# Patient Record
Sex: Female | Born: 1965 | Race: White | Hispanic: No | Marital: Married | Smoking: Never smoker
Health system: Southern US, Community
[De-identification: ages and names within clinical notes are randomized; demographics above are authoritative.]

## PROBLEM LIST (undated history)

## (undated) DIAGNOSIS — Z8619 Personal history of other infectious and parasitic diseases: Secondary | ICD-10-CM

## (undated) DIAGNOSIS — D649 Anemia, unspecified: Secondary | ICD-10-CM

## (undated) DIAGNOSIS — R32 Unspecified urinary incontinence: Secondary | ICD-10-CM

## (undated) DIAGNOSIS — I341 Nonrheumatic mitral (valve) prolapse: Secondary | ICD-10-CM

## (undated) DIAGNOSIS — F419 Anxiety disorder, unspecified: Secondary | ICD-10-CM

## (undated) DIAGNOSIS — E785 Hyperlipidemia, unspecified: Secondary | ICD-10-CM

## (undated) DIAGNOSIS — I4510 Unspecified right bundle-branch block: Secondary | ICD-10-CM

## (undated) DIAGNOSIS — R002 Palpitations: Secondary | ICD-10-CM

## (undated) HISTORY — DX: Unspecified right bundle-branch block: I45.10

## (undated) HISTORY — PX: TUBAL LIGATION: SHX77

## (undated) HISTORY — DX: Anemia, unspecified: D64.9

## (undated) HISTORY — DX: Palpitations: R00.2

## (undated) HISTORY — DX: Unspecified urinary incontinence: R32

## (undated) HISTORY — DX: Personal history of other infectious and parasitic diseases: Z86.19

## (undated) HISTORY — DX: Nonrheumatic mitral (valve) prolapse: I34.1

## (undated) HISTORY — PX: CHOLECYSTECTOMY: SHX55

## (undated) HISTORY — DX: Anxiety disorder, unspecified: F41.9

## (undated) HISTORY — DX: Hyperlipidemia, unspecified: E78.5

---

## 1994-08-23 HISTORY — PX: TUBAL LIGATION: SHX77

## 2001-04-04 ENCOUNTER — Other Ambulatory Visit: Admission: RE | Admit: 2001-04-04 | Discharge: 2001-04-04 | Payer: Self-pay | Admitting: Obstetrics & Gynecology

## 2002-04-05 ENCOUNTER — Other Ambulatory Visit: Admission: RE | Admit: 2002-04-05 | Discharge: 2002-04-05 | Payer: Self-pay | Admitting: Obstetrics & Gynecology

## 2003-04-08 ENCOUNTER — Other Ambulatory Visit: Admission: RE | Admit: 2003-04-08 | Discharge: 2003-04-08 | Payer: Self-pay | Admitting: Obstetrics & Gynecology

## 2004-04-08 ENCOUNTER — Other Ambulatory Visit: Admission: RE | Admit: 2004-04-08 | Discharge: 2004-04-08 | Payer: Self-pay | Admitting: Obstetrics & Gynecology

## 2005-04-13 ENCOUNTER — Other Ambulatory Visit: Admission: RE | Admit: 2005-04-13 | Discharge: 2005-04-13 | Payer: Self-pay | Admitting: Obstetrics & Gynecology

## 2007-07-10 ENCOUNTER — Encounter: Admission: RE | Admit: 2007-07-10 | Discharge: 2007-07-10 | Payer: Self-pay | Admitting: Family Medicine

## 2007-09-12 ENCOUNTER — Encounter: Admission: RE | Admit: 2007-09-12 | Discharge: 2007-09-12 | Payer: Self-pay | Admitting: Family Medicine

## 2010-06-29 ENCOUNTER — Ambulatory Visit: Payer: Self-pay | Admitting: Cardiology

## 2010-09-13 ENCOUNTER — Encounter: Payer: Self-pay | Admitting: Family Medicine

## 2010-12-01 ENCOUNTER — Telehealth: Payer: Self-pay | Admitting: Cardiology

## 2010-12-01 NOTE — Telephone Encounter (Signed)
Needs to get her 6 month bloodwork scheduled.

## 2010-12-01 NOTE — Telephone Encounter (Signed)
RN scheduled labs with pt.

## 2010-12-16 ENCOUNTER — Other Ambulatory Visit: Payer: Self-pay | Admitting: *Deleted

## 2010-12-16 DIAGNOSIS — E7800 Pure hypercholesterolemia, unspecified: Secondary | ICD-10-CM

## 2010-12-29 ENCOUNTER — Other Ambulatory Visit (INDEPENDENT_AMBULATORY_CARE_PROVIDER_SITE_OTHER): Payer: BC Managed Care – PPO | Admitting: *Deleted

## 2010-12-29 DIAGNOSIS — E7800 Pure hypercholesterolemia, unspecified: Secondary | ICD-10-CM

## 2010-12-29 LAB — HEPATIC FUNCTION PANEL
ALT: 23 U/L (ref 0–35)
Bilirubin, Direct: 0.1 mg/dL (ref 0.0–0.3)
Total Protein: 6.3 g/dL (ref 6.0–8.3)

## 2010-12-29 LAB — LIPID PANEL
Cholesterol: 197 mg/dL (ref 0–200)
HDL: 56 mg/dL (ref >39.00)
LDL Cholesterol: 121 mg/dL — ABNORMAL HIGH (ref 0–99)
Total CHOL/HDL Ratio: 4

## 2010-12-29 LAB — BASIC METABOLIC PANEL
Creatinine, Ser: 1 mg/dL (ref 0.4–1.2)
Sodium: 139 mEq/L (ref 135–145)

## 2011-01-01 ENCOUNTER — Telehealth: Payer: Self-pay | Admitting: *Deleted

## 2011-01-01 NOTE — Telephone Encounter (Signed)
Labs reported 

## 2011-01-12 ENCOUNTER — Telehealth: Payer: Self-pay | Admitting: Cardiology

## 2011-01-12 NOTE — Telephone Encounter (Signed)
RETURNED CALL ABOUT SCHEDULING WITH NEW DR.

## 2011-03-27 ENCOUNTER — Other Ambulatory Visit: Payer: Self-pay | Admitting: Cardiology

## 2011-03-29 ENCOUNTER — Other Ambulatory Visit: Payer: Self-pay | Admitting: *Deleted

## 2011-03-29 MED ORDER — SIMVASTATIN 20 MG PO TABS: 20.0000 mg | ORAL_TABLET | Freq: Every day | ORAL | Status: DC

## 2011-03-29 NOTE — Telephone Encounter (Signed)
Fax received from pharmacy. Refill completed. Jodette Donzell Coller RN  

## 2011-07-02 ENCOUNTER — Encounter: Payer: Self-pay | Admitting: *Deleted

## 2011-07-06 ENCOUNTER — Encounter: Payer: Self-pay | Admitting: Cardiovascular Disease

## 2011-07-06 ENCOUNTER — Ambulatory Visit (INDEPENDENT_AMBULATORY_CARE_PROVIDER_SITE_OTHER): Payer: BC Managed Care – PPO | Admitting: Cardiovascular Disease

## 2011-07-06 DIAGNOSIS — R011 Cardiac murmur, unspecified: Secondary | ICD-10-CM

## 2011-07-06 DIAGNOSIS — E785 Hyperlipidemia, unspecified: Secondary | ICD-10-CM

## 2011-07-06 DIAGNOSIS — I341 Nonrheumatic mitral (valve) prolapse: Secondary | ICD-10-CM

## 2011-07-06 DIAGNOSIS — I059 Rheumatic mitral valve disease, unspecified: Secondary | ICD-10-CM

## 2011-07-06 LAB — BASIC METABOLIC PANEL
BUN: 12 mg/dL (ref 6–23)
Calcium: 8.8 mg/dL (ref 8.4–10.5)
Creatinine, Ser: 0.9 mg/dL (ref 0.4–1.2)
GFR: 74.2 mL/min (ref >60.00)
Glucose, Bld: 88 mg/dL (ref 70–99)
Potassium: 4.3 mEq/L (ref 3.5–5.1)

## 2011-07-06 LAB — HEPATIC FUNCTION PANEL
ALT: 20 U/L (ref 0–35)
AST: 22 U/L (ref 0–37)
Albumin: 3.4 g/dL — ABNORMAL LOW (ref 3.5–5.2)
Alkaline Phosphatase: 25 U/L — ABNORMAL LOW (ref 39–117)
Bilirubin, Direct: 0.1 mg/dL (ref 0.0–0.3)
Total Bilirubin: 1 mg/dL (ref 0.3–1.2)

## 2011-07-06 LAB — LIPID PANEL
Cholesterol: 170 mg/dL (ref 0–200)
LDL Cholesterol: 90 mg/dL (ref 0–99)

## 2011-07-06 MED ORDER — ATORVASTATIN CALCIUM 20 MG PO TABS: 20.0000 mg | ORAL_TABLET | Freq: Every day | ORAL | Status: DC

## 2011-07-06 NOTE — Assessment & Plan Note (Signed)
She remains quite stable.  I'll change her Simvastatin to Atorvastatin 20 mg daily.  We'll recheck her cholesterol levels in 3 months and again in one year.

## 2011-07-06 NOTE — Patient Instructions (Addendum)
Your physician wants you to follow-up in: 1 year You will receive a reminder letter in the mail two months in advance. If you don't receive a letter, please call our office to schedule the follow-up appointment.  Your physician recommends that you return for a FASTING lipid profile: 3 months and today.  Your physician has requested that you have an echocardiogram. Echocardiography is a painless test that uses sound waves to create images of your heart. It provides your doctor with information about the size and shape of your heart and how well your heart's chambers and valves are working. This procedure takes approximately one hour. There are no restrictions for this procedure.   Your physician has recommended you make the following change in your medication:   1) STOP zocor  2) START atorvastatin 20 mg  I called pt with procedure and dx code to check insurance,

## 2011-07-06 NOTE — Progress Notes (Signed)
  Jessica Bradford Date of Birth  1966/07/01 [facility] [address]    [address] [place], [place]  [address] [phone]  Fax  [phone]  History of Present Illness:  Jessica Bradford is a 45 year old female with a history of hypercholesterolemia. She has a very strong family history of coronary artery disease. She's done very well since she last saw Dr. tenet last year. She walks 4-6 miles every day.  She denies any chest pain or shortness of breath with her walking.  She has a history of mitral valve prolapse. She has occasional palpitations.  These  typically occur when she is at rest.   Current Outpatient Prescriptions on File Prior to Visit  Medication Sig Dispense Refill  . ALPRAZolam (XANAX) 0.5 MG tablet Take 0.5 mg by mouth as needed.        Marland Kitchen aspirin 81 MG tablet Take 81 mg by mouth daily.        . multivitamin (THERAGRAN) per tablet Take 1 tablet by mouth daily.        . simvastatin (ZOCOR) 20 MG tablet Take 1 tablet (20 mg total) by mouth at bedtime.  30 tablet  3    Allergies  Allergen Reactions  . Doxycycline   . Sulfur     Past Medical History  Diagnosis Date  . Hyperlipidemia   . Incomplete right bundle branch block   . Mitral valve prolapse   . Heart palpitations     Past Surgical History  Procedure Date  . Cholecystectomy     History  Smoking status  . Never Smoker   Smokeless tobacco  . Not on file    History  Alcohol Use: Not on file    Family History  Problem Relation Age of Onset  . Heart attack Father   . Heart disease      " unkle "  . Heart attack Mother     Reviw of Systems:  Reviewed in the HPI.  All other systems are negative.  Physical Exam: BP 118/80  Pulse 65  Ht 5\' 6"  (1.676 m)  Wt 188 lb 1.9 oz (85.331 kg)  BMI 30.36 kg/m2 The patient is alert and oriented x 3.  The mood and affect are normal.   Skin: warm and dry.  Color is normal.    HEENT:   Her neck is supple. She has normal carotids. There is no  JVD.  Lungs: Her lung exam is clear.   Heart: Regular rate, S1-S2. I did not hear a midsystolic click. She has a very soft systolic murmur.    Abdomen: Her abdomen is soft. She has good bowel sounds. There is no hepatosplenomegaly.  Extremities:  No clubbing cyanosis or edema.  Neuro:  The exam is nonfocal. Her gait is normal to    ECG: Normal sinus rhythm. She has left axis deviation. There is incomplete right bundle branch block  Assessment / Plan:

## 2011-07-06 NOTE — Assessment & Plan Note (Signed)
She is a soft systolic murmur. She had an echocardiogram approximately 17 years ago. We'll repeat her echocardiogram for further assessment of her mitral valve prolapse.

## 2011-07-13 ENCOUNTER — Ambulatory Visit (HOSPITAL_COMMUNITY): Payer: BC Managed Care – PPO | Attending: Cardiovascular Disease

## 2011-07-13 DIAGNOSIS — E785 Hyperlipidemia, unspecified: Secondary | ICD-10-CM | POA: Insufficient documentation

## 2011-07-13 DIAGNOSIS — I059 Rheumatic mitral valve disease, unspecified: Secondary | ICD-10-CM | POA: Insufficient documentation

## 2011-07-13 DIAGNOSIS — I379 Nonrheumatic pulmonary valve disorder, unspecified: Secondary | ICD-10-CM | POA: Insufficient documentation

## 2011-07-13 DIAGNOSIS — R011 Cardiac murmur, unspecified: Secondary | ICD-10-CM | POA: Insufficient documentation

## 2011-07-13 DIAGNOSIS — I079 Rheumatic tricuspid valve disease, unspecified: Secondary | ICD-10-CM | POA: Insufficient documentation

## 2011-07-26 ENCOUNTER — Other Ambulatory Visit: Payer: Self-pay | Admitting: Cardiovascular Disease

## 2011-10-04 ENCOUNTER — Other Ambulatory Visit: Payer: BC Managed Care – PPO | Admitting: *Deleted

## 2011-11-01 ENCOUNTER — Other Ambulatory Visit (INDEPENDENT_AMBULATORY_CARE_PROVIDER_SITE_OTHER): Payer: BC Managed Care – PPO

## 2011-11-01 DIAGNOSIS — E785 Hyperlipidemia, unspecified: Secondary | ICD-10-CM

## 2011-11-01 DIAGNOSIS — R011 Cardiac murmur, unspecified: Secondary | ICD-10-CM

## 2011-11-01 LAB — HEPATIC FUNCTION PANEL
ALT: 20 U/L (ref 0–35)
Albumin: 3.4 g/dL — ABNORMAL LOW (ref 3.5–5.2)
Total Protein: 6.4 g/dL (ref 6.0–8.3)

## 2011-11-01 LAB — LIPID PANEL
Cholesterol: 176 mg/dL (ref 0–200)
Total CHOL/HDL Ratio: 3
VLDL: 18.4 mg/dL (ref 0.0–40.0)

## 2011-11-01 LAB — BASIC METABOLIC PANEL
Calcium: 8.9 mg/dL (ref 8.4–10.5)
Chloride: 106 mEq/L (ref 96–112)
GFR: 60.31 mL/min (ref >60.00)
Sodium: 136 mEq/L (ref 135–145)

## 2011-12-02 ENCOUNTER — Other Ambulatory Visit: Payer: Self-pay | Admitting: Cardiovascular Disease

## 2011-12-02 ENCOUNTER — Telehealth: Payer: Self-pay | Admitting: Cardiovascular Disease

## 2011-12-02 DIAGNOSIS — R011 Cardiac murmur, unspecified: Secondary | ICD-10-CM

## 2011-12-02 DIAGNOSIS — E785 Hyperlipidemia, unspecified: Secondary | ICD-10-CM

## 2011-12-02 MED ORDER — ATORVASTATIN CALCIUM 20 MG PO TABS: 20.0000 mg | ORAL_TABLET | Freq: Every day | ORAL | Status: DC

## 2011-12-02 NOTE — Telephone Encounter (Signed)
Called and left message for pt that her atorvastatin (LIPITOR) 20 MG tablet has been called into her pharmacy -- Rx will be done before noon today.

## 2011-12-02 NOTE — Telephone Encounter (Signed)
Pt needs atorvastatin called in this am, pharmacy requested Tuesday and as of this am they don't have it, pt going out of town today at 2p, pls call asap

## 2011-12-13 ENCOUNTER — Other Ambulatory Visit: Payer: Self-pay | Admitting: Cardiovascular Disease

## 2011-12-13 DIAGNOSIS — R011 Cardiac murmur, unspecified: Secondary | ICD-10-CM

## 2011-12-13 DIAGNOSIS — E785 Hyperlipidemia, unspecified: Secondary | ICD-10-CM

## 2011-12-13 MED ORDER — ATORVASTATIN CALCIUM 20 MG PO TABS: 20.0000 mg | ORAL_TABLET | Freq: Every day | ORAL | Status: DC

## 2012-05-23 ENCOUNTER — Telehealth: Payer: Self-pay | Admitting: Cardiovascular Disease

## 2012-05-23 NOTE — Telephone Encounter (Signed)
LMTCB

## 2012-05-23 NOTE — Telephone Encounter (Signed)
plz return call to pt [phone], she wants to discuss labs for upcoming appnt .

## 2012-05-24 NOTE — Telephone Encounter (Signed)
Pt rtn your call

## 2012-05-24 NOTE — Telephone Encounter (Signed)
Discussed lab work, pt will come fasting and will order after visit, pt agreed to plan.

## 2012-07-11 ENCOUNTER — Encounter: Payer: Self-pay | Admitting: Cardiovascular Disease

## 2012-07-11 ENCOUNTER — Ambulatory Visit (INDEPENDENT_AMBULATORY_CARE_PROVIDER_SITE_OTHER): Payer: BC Managed Care – PPO | Admitting: Cardiovascular Disease

## 2012-07-11 VITALS — BP 104/74 | HR 65 | Ht 66.0 in | Wt 195.1 lb

## 2012-07-11 DIAGNOSIS — I341 Nonrheumatic mitral (valve) prolapse: Secondary | ICD-10-CM

## 2012-07-11 DIAGNOSIS — E785 Hyperlipidemia, unspecified: Secondary | ICD-10-CM

## 2012-07-11 DIAGNOSIS — I059 Rheumatic mitral valve disease, unspecified: Secondary | ICD-10-CM

## 2012-07-11 LAB — BASIC METABOLIC PANEL
CO2: 27 mEq/L (ref 19–32)
Glucose, Bld: 103 mg/dL — ABNORMAL HIGH (ref 70–99)
Potassium: 5 mEq/L (ref 3.5–5.1)
Sodium: 135 mEq/L (ref 135–145)

## 2012-07-11 LAB — HEPATIC FUNCTION PANEL
Bilirubin, Direct: 0.2 mg/dL (ref 0.0–0.3)
Total Bilirubin: 1.2 mg/dL (ref 0.3–1.2)

## 2012-07-11 LAB — LIPID PANEL
Triglycerides: 78 mg/dL (ref 0.0–149.0)
VLDL: 15.6 mg/dL (ref 0.0–40.0)

## 2012-07-11 NOTE — Assessment & Plan Note (Signed)
Jessica Bradford is doing well. She's not had any chest pains or shortness breath. She is tolerating her medications well.  We'll check a Lipomed profile today. We'll also check fasting lipid level, hepatic profile, and basic metabolic profile.  I will see her again in one year for an office visit and similar blood work.

## 2012-07-11 NOTE — Progress Notes (Signed)
  Jessica Bradford Date of Birth  09-Jan-1966 [facility] [address]    [address] [place], [place]  [address] [phone]  Fax  [phone]  Problem List 1. Hyperliidemia   History of Present Illness:  Jessica Bradford is a 46 year old female with a history of hypercholesterolemia. She has a very strong family history of coronary artery disease. She's done very well since she last saw Dr. tenet last year. She walks 4-6 miles every day.  She denies any chest pain or shortness of breath with her walking.  She has a history of mitral valve prolapse. She has occasional palpitations.  These  typically occur when she is at rest.   She is recovering from an episode of cellulitis. She apparently stepped on a splinter on her deck and developed cellulitis in her foot.   She was on antibiotics for several weeks. She is still not exercising because of some persistent foot pain and swelling.  Current Outpatient Prescriptions on File Prior to Visit  Medication Sig Dispense Refill  . ALPRAZolam (XANAX) 0.5 MG tablet Take 0.5 mg by mouth as needed.        Marland Kitchen aspirin 81 MG tablet Take 81 mg by mouth daily.        Marland Kitchen atorvastatin (LIPITOR) 20 MG tablet Take 1 tablet (20 mg total) by mouth daily.  90 tablet  2  . multivitamin (THERAGRAN) per tablet Take 1 tablet by mouth daily.          Allergies  Allergen Reactions  . Doxycycline   . Sulfur     Past Medical History  Diagnosis Date  . Hyperlipidemia   . Incomplete right bundle branch block   . Mitral valve prolapse   . Heart palpitations     Past Surgical History  Procedure Date  . Cholecystectomy     History  Smoking status  . Never Smoker   Smokeless tobacco  . Not on file    History  Alcohol Use: Not on file    Family History  Problem Relation Age of Onset  . Heart attack Father   . Heart disease      " unkle "  . Heart attack Mother     Reviw of Systems:  Reviewed in the HPI.  All other systems are  negative.  Physical Exam: BP 104/74  Pulse 65  Ht 5\' 6"  (1.676 m)  Wt 195 lb 1.9 oz (88.506 kg)  BMI 31.49 kg/m2  SpO2 99% The patient is alert and oriented x 3.  The mood and affect are normal.   Skin: warm and dry.  Color is normal.    HEENT:   Her neck is supple. She has normal carotids. There is no JVD.  Lungs: Her lung exam is clear.   Heart: Regular rate, S1-S2. I did not hear a midsystolic click. She has a very soft systolic murmur.    Abdomen: Her abdomen is soft. She has good bowel sounds. There is no hepatosplenomegaly.  Extremities:  No clubbing cyanosis or edema.  Neuro:  The exam is nonfocal. Her gait is normal to    ECG: 07/11/2012 Normal sinus rhythm at 68. She has left axis deviation. There is incomplete right bundle branch block  Assessment / Plan:

## 2012-07-11 NOTE — Patient Instructions (Addendum)
Your physician wants you to follow-up in: 1 year You will receive a reminder letter in the mail two months in advance. If you don't receive a letter, please call our office to schedule the follow-up appointment.  Your physician recommends that you return for a FASTING lipid profile: today, i will call you with results in 2-3 days, the lipomed profile takes loger and i will call in 1-2 weeks

## 2012-07-12 LAB — NMR, LIPOPROFILE

## 2012-07-21 ENCOUNTER — Telehealth: Payer: Self-pay | Admitting: *Deleted

## 2012-07-21 DIAGNOSIS — Z79899 Other long term (current) drug therapy: Secondary | ICD-10-CM

## 2012-07-21 DIAGNOSIS — E785 Hyperlipidemia, unspecified: Secondary | ICD-10-CM

## 2012-07-21 DIAGNOSIS — R011 Cardiac murmur, unspecified: Secondary | ICD-10-CM

## 2012-07-21 MED ORDER — ATORVASTATIN CALCIUM 80 MG PO TABS: 80.0000 mg | ORAL_TABLET | Freq: Every evening | ORAL | Status: DC

## 2012-07-21 NOTE — Telephone Encounter (Signed)
Message copied by Lesle Chris on Fri Jul 21, 2012  1:46 PM ------      Message from: [place], [place] J      Created: Fri Jul 21, 2012  8:45 AM       Her LDL particle number is [id number] ( goal of < 1000) .      I would suggest increasing her atorvastatin to 80.  Recheck fasting  Lipomed profile and HFP, BMP in 3 months.

## 2012-07-21 NOTE — Telephone Encounter (Signed)
Notes Recorded by Lesle Chris, LPN on 11/91/4782 at 1:45 PM Patient notified. Will send new rx to [facility] at pt request. Patient will call office to schedule lab work the end of February 2014. Notes Recorded by Lesle Chris, LPN on 95/62/1308 at 11:06 AM Left message to return call.

## 2012-08-18 ENCOUNTER — Encounter: Payer: Self-pay | Admitting: Cardiology

## 2012-10-07 ENCOUNTER — Other Ambulatory Visit: Payer: Self-pay

## 2012-10-16 ENCOUNTER — Other Ambulatory Visit (INDEPENDENT_AMBULATORY_CARE_PROVIDER_SITE_OTHER): Payer: BC Managed Care – PPO

## 2012-10-16 DIAGNOSIS — Z79899 Other long term (current) drug therapy: Secondary | ICD-10-CM

## 2012-10-16 DIAGNOSIS — E785 Hyperlipidemia, unspecified: Secondary | ICD-10-CM

## 2012-10-17 LAB — NMR LIPOPROFILE WITH LIPIDS
HDL Size: 9.4 nm (ref >=9.2)
HDL-C: 45 mg/dL (ref >=40)
LDL (calc): 67 mg/dL (ref <100)
LDL Particle Number: 977 nmol/L (ref <1000)
LDL Size: 20.4 nm — ABNORMAL LOW (ref >20.5)
LP-IR Score: 42 (ref <=45)
VLDL Size: 48.1 nm — ABNORMAL HIGH (ref >=46.6)

## 2012-10-18 ENCOUNTER — Ambulatory Visit (INDEPENDENT_AMBULATORY_CARE_PROVIDER_SITE_OTHER): Payer: BC Managed Care – PPO | Admitting: *Deleted

## 2012-10-18 DIAGNOSIS — E785 Hyperlipidemia, unspecified: Secondary | ICD-10-CM

## 2012-10-18 DIAGNOSIS — I059 Rheumatic mitral valve disease, unspecified: Secondary | ICD-10-CM

## 2012-10-18 LAB — BASIC METABOLIC PANEL
Calcium: 9.1 mg/dL (ref 8.4–10.5)
Creatinine, Ser: 1 mg/dL (ref 0.4–1.2)
GFR: 60.73 mL/min (ref >60.00)
Sodium: 138 mEq/L (ref 135–145)

## 2012-10-18 LAB — HEPATIC FUNCTION PANEL
Albumin: 3.6 g/dL (ref 3.5–5.2)
Alkaline Phosphatase: 31 U/L — ABNORMAL LOW (ref 39–117)
Bilirubin, Direct: 0.1 mg/dL (ref 0.0–0.3)

## 2012-10-31 ENCOUNTER — Telehealth: Payer: Self-pay | Admitting: Cardiovascular Disease

## 2012-10-31 NOTE — Telephone Encounter (Signed)
Returned patient's call to inform her that we did not collect CBC or TSH.

## 2012-10-31 NOTE — Telephone Encounter (Signed)
New problem    previous lab drawn on 2/24 . Checking to see if CBC & TSH was drawn as well.     Fax results to  [phone].

## 2013-06-02 ENCOUNTER — Other Ambulatory Visit: Payer: Self-pay | Admitting: Cardiovascular Disease

## 2013-06-28 ENCOUNTER — Other Ambulatory Visit: Payer: Self-pay

## 2013-07-11 ENCOUNTER — Encounter: Payer: Self-pay | Admitting: Cardiovascular Disease

## 2013-07-11 ENCOUNTER — Ambulatory Visit (INDEPENDENT_AMBULATORY_CARE_PROVIDER_SITE_OTHER): Payer: BC Managed Care – PPO | Admitting: Cardiovascular Disease

## 2013-07-11 VITALS — BP 115/65 | HR 66 | Ht 66.0 in | Wt 181.0 lb

## 2013-07-11 DIAGNOSIS — I059 Rheumatic mitral valve disease, unspecified: Secondary | ICD-10-CM

## 2013-07-11 DIAGNOSIS — I341 Nonrheumatic mitral (valve) prolapse: Secondary | ICD-10-CM

## 2013-07-11 DIAGNOSIS — E785 Hyperlipidemia, unspecified: Secondary | ICD-10-CM

## 2013-07-11 LAB — HEPATIC FUNCTION PANEL
ALT: 24 U/L (ref 0–35)
AST: 23 U/L (ref 0–37)
Albumin: 3.6 g/dL (ref 3.5–5.2)
Alkaline Phosphatase: 32 U/L — ABNORMAL LOW (ref 39–117)
Bilirubin, Direct: 0.2 mg/dL (ref 0.0–0.3)
Total Bilirubin: 1.2 mg/dL (ref 0.3–1.2)
Total Protein: 6.7 g/dL (ref 6.0–8.3)

## 2013-07-11 LAB — BASIC METABOLIC PANEL
BUN: 17 mg/dL (ref 6–23)
CO2: 25 mEq/L (ref 19–32)
Chloride: 105 mEq/L (ref 96–112)
GFR: 70.75 mL/min (ref >60.00)
Glucose, Bld: 96 mg/dL (ref 70–99)
Potassium: 4.5 mEq/L (ref 3.5–5.1)
Sodium: 137 mEq/L (ref 135–145)

## 2013-07-11 NOTE — Patient Instructions (Signed)
Your physician recommends that you return for a FASTING lipid profile: TODAY W/ NMR  Your physician wants you to follow-up in: 35 MONTHS You will receive a reminder letter in the mail two months in advance. If you don't receive a letter, please call our office to schedule the follow-up appointment.   Your physician recommends that you continue on your current medications as directed. Please refer to the Current Medication list given to you today.

## 2013-07-11 NOTE — Progress Notes (Signed)
Jessica Bradford Date of Birth  08/11/1966  [facility] [address]    [address] [place], [place]  [address] [phone]  Fax  [phone]  Problem List 1. Hyperliidemia   History of Present Illness:  Jessica Bradford is a 47 year old female with a history of hypercholesterolemia. She has a very strong family history of coronary artery disease. She's done very well since she last saw Dr. Deborah Chalk last year. She walks 4-6 miles every day.  She denies any chest pain or shortness of breath with her walking.  She has a history of mitral valve prolapse. She has occasional palpitations.  These  typically occur when she is at rest.   She is recovering from an episode of cellulitis. She apparently stepped on a splinter on her deck and developed cellulitis in her foot.   She was on antibiotics for several weeks. She is still not exercising because of some persistent foot pain and swelling.  Nov. 19, 2014:  No CP, no dyspnea,  Still walking 5+ miles a day.  Also doing strength training.    She gets a discount from her insurance plan to work out.    Current Outpatient Prescriptions on File Prior to Visit  Medication Sig Dispense Refill  . ALPRAZolam (XANAX) 0.5 MG tablet Take 0.5 mg by mouth as needed.        Marland Kitchen aspirin 81 MG tablet Take 81 mg by mouth daily.        Marland Kitchen atorvastatin (LIPITOR) 80 MG tablet TAKE 1 TABLET EVERY EVENING (DOSE INCREASED 07/21/2012)  90 tablet  0  . multivitamin (THERAGRAN) per tablet Take 1 tablet by mouth daily.        . NON FORMULARY Birth Control Pills Daily       No current facility-administered medications on file prior to visit.    Allergies  Allergen Reactions  . Doxycycline   . Sulfur     Past Medical History  Diagnosis Date  . Hyperlipidemia   . Incomplete right bundle branch block   . Mitral valve prolapse   . Heart palpitations     Past Surgical History  Procedure Laterality Date  . Cholecystectomy      History  Smoking status  . Never  Smoker   Smokeless tobacco  . Not on file    History  Alcohol Use: Not on file    Family History  Problem Relation Age of Onset  . Heart attack Father   . Heart disease      " unkle "  . Heart attack Mother     Reviw of Systems:  Reviewed in the HPI.  All other systems are negative.  Physical Exam: BP 115/65  Pulse 66  Ht 5\' 6"  (1.676 m)  Wt 181 lb (82.101 kg)  BMI 29.23 kg/m2 The patient is alert and oriented x 3.  The mood and affect are normal.   Skin: warm and dry.  Color is normal.    HEENT:   Her neck is supple. She has normal carotids. There is no JVD.  Lungs: Her lung exam is clear.   Heart: Regular rate, S1-S2. I did not hear a midsystolic click. She has a very soft systolic murmur.    Abdomen: Her abdomen is soft. She has good bowel sounds. There is no hepatosplenomegaly.  Extremities:  No clubbing cyanosis or edema.  Neuro:  The exam is nonfocal. Her gait is normal to    ECG: Nov. 19, 2014:  NSR at 61, inc. RBBB.  Assessment / Plan:

## 2013-07-11 NOTE — Assessment & Plan Note (Signed)
Lipids and lipomed levels have been good.  Will check today.  Recheck in 18 months at next ov.  Continue meds.  She is an active walker.

## 2013-07-12 LAB — NMR LIPOPROFILE WITH LIPIDS
HDL Particle Number: 42.8 umol/L (ref >=30.5)
HDL Size: 9.7 nm (ref >=9.2)
HDL-C: 57 mg/dL (ref >=40)
LDL (calc): 60 mg/dL (ref <100)
LP-IR Score: <25 (ref <=45)
Large HDL-P: 11 umol/L (ref >=4.8)
Large VLDL-P: 2 nmol/L (ref <=2.7)
Triglycerides: 96 mg/dL (ref <150)

## 2013-07-16 NOTE — Progress Notes (Signed)
Quick Note:  Patient notified of lab results. Patient verbalized understanding and agreement with current treatment plan. ______ 

## 2013-08-14 ENCOUNTER — Other Ambulatory Visit: Payer: Self-pay | Admitting: Cardiovascular Disease

## 2014-08-27 ENCOUNTER — Other Ambulatory Visit: Payer: Self-pay | Admitting: Cardiovascular Disease

## 2014-11-06 ENCOUNTER — Other Ambulatory Visit: Payer: Self-pay | Admitting: Nurse Practitioner

## 2014-11-06 DIAGNOSIS — E785 Hyperlipidemia, unspecified: Secondary | ICD-10-CM

## 2014-12-23 ENCOUNTER — Ambulatory Visit (INDEPENDENT_AMBULATORY_CARE_PROVIDER_SITE_OTHER): Payer: BLUE CROSS/BLUE SHIELD | Admitting: Cardiovascular Disease

## 2014-12-23 ENCOUNTER — Encounter: Payer: Self-pay | Admitting: Cardiovascular Disease

## 2014-12-23 ENCOUNTER — Other Ambulatory Visit (INDEPENDENT_AMBULATORY_CARE_PROVIDER_SITE_OTHER): Payer: BLUE CROSS/BLUE SHIELD | Admitting: *Deleted

## 2014-12-23 DIAGNOSIS — E785 Hyperlipidemia, unspecified: Secondary | ICD-10-CM

## 2014-12-23 DIAGNOSIS — I059 Rheumatic mitral valve disease, unspecified: Secondary | ICD-10-CM | POA: Diagnosis not present

## 2014-12-23 DIAGNOSIS — I341 Nonrheumatic mitral (valve) prolapse: Secondary | ICD-10-CM

## 2014-12-23 LAB — LIPID PANEL
Cholesterol: 154 mg/dL (ref 0–200)
HDL: 66.5 mg/dL (ref >39.00)
LDL CALC: 73 mg/dL (ref 0–99)
NONHDL: 87.5
Total CHOL/HDL Ratio: 2
Triglycerides: 72 mg/dL (ref 0.0–149.0)
VLDL: 14.4 mg/dL (ref 0.0–40.0)

## 2014-12-23 LAB — HEPATIC FUNCTION PANEL
ALT: 23 U/L (ref 0–35)
AST: 22 U/L (ref 0–37)
Albumin: 3.7 g/dL (ref 3.5–5.2)
Alkaline Phosphatase: 32 U/L — ABNORMAL LOW (ref 39–117)
Bilirubin, Direct: 0.2 mg/dL (ref 0.0–0.3)
TOTAL PROTEIN: 6.8 g/dL (ref 6.0–8.3)
Total Bilirubin: 0.9 mg/dL (ref 0.2–1.2)

## 2014-12-23 LAB — BASIC METABOLIC PANEL
BUN: 18 mg/dL (ref 6–23)
CHLORIDE: 104 meq/L (ref 96–112)
CO2: 27 mEq/L (ref 19–32)
Calcium: 9 mg/dL (ref 8.4–10.5)
Creatinine, Ser: 0.9 mg/dL (ref 0.40–1.20)
GFR: 70.33 mL/min (ref >60.00)
Glucose, Bld: 89 mg/dL (ref 70–99)
Potassium: 4.5 mEq/L (ref 3.5–5.1)
SODIUM: 136 meq/L (ref 135–145)

## 2014-12-23 MED ORDER — ATORVASTATIN CALCIUM 80 MG PO TABS: 80.0000 mg | ORAL_TABLET | Freq: Every day | ORAL | Status: DC

## 2014-12-23 NOTE — Progress Notes (Signed)
Cardiology Office Note   Date:  12/23/2014   ID:  Jessica Bradford, DOB 22-Dec-1965, MRN [id number]  PCP:  Joycelyn Rua, MD  Cardiologist:   Vesta Mixer, MD   1. Hyperliidemia   History of Present Illness:  Jessica Bradford is a 49 year old female with a history of hypercholesterolemia. She has a very strong family history of coronary artery disease. She's done very well since she last saw Dr. Deborah Chalk last year. She walks 4-6 miles every day. She denies any chest pain or shortness of breath with her walking.  She has a history of mitral valve prolapse. She has occasional palpitations. These typically occur when she is at rest.  She is recovering from an episode of cellulitis. She apparently stepped on a splinter on her deck and developed cellulitis in her foot. She was on antibiotics for several weeks. She is still not exercising because of some persistent foot pain and swelling.  Nov. 19, 2014:  No CP, no dyspnea, Still walking 5+ miles a day. Also doing strength training. She gets a discount from her insurance plan to work out    Dec 23, 2014:  Jessica Bradford is a 49 y.o. female who presents for  Follow up of her hyperlipidemia Walks regularly,  Also does strength training.  Trying to watch her diet.     Past Medical History  Diagnosis Date  . Hyperlipidemia   . Incomplete right bundle branch block   . Mitral valve prolapse   . Heart palpitations     Past Surgical History  Procedure Laterality Date  . Cholecystectomy       Current Outpatient Prescriptions  Medication Sig Dispense Refill  . ALPRAZolam (XANAX) 0.5 MG tablet Take 0.5 mg by mouth as needed.      Marland Kitchen aspirin 81 MG tablet Take 81 mg by mouth daily.      Marland Kitchen atorvastatin (LIPITOR) 80 MG tablet TAKE 1 TABLET EVERY EVENING (DOSE INCREASED 11 29 2013) 90 tablet 1  . LOW-OGESTREL 0.3-30 MG-MCG tablet     . multivitamin (THERAGRAN) per tablet Take 1 tablet by mouth daily.      . NON FORMULARY Birth  Control Pills Daily     No current facility-administered medications for this visit.    Allergies:   Sulfa antibiotics; Doxycycline; and Sulfur    Social History:  The patient  reports that she has never smoked. She does not have any smokeless tobacco history on file.   Family History:  The patient's family history includes Heart attack in her father and mother; Heart disease in an other family member.    ROS:  Please see the history of present illness.    Review of Systems: Constitutional:  denies fever, chills, diaphoresis, appetite change and fatigue.  HEENT: denies photophobia, eye pain, redness, hearing loss, ear pain, congestion, sore throat, rhinorrhea, sneezing, neck pain, neck stiffness and tinnitus.  Respiratory: denies SOB, DOE, cough, chest tightness, and wheezing.  Cardiovascular: denies chest pain, palpitations and leg swelling.  Gastrointestinal: denies nausea, vomiting, abdominal pain, diarrhea, constipation, blood in stool.  Genitourinary: denies dysuria, urgency, frequency, hematuria, flank pain and difficulty urinating.  Musculoskeletal: denies  myalgias, back pain, joint swelling, arthralgias and gait problem.   Skin: denies pallor, rash and wound.  Neurological: denies dizziness, seizures, syncope, weakness, light-headedness, numbness and headaches.   Hematological: denies adenopathy, easy bruising, personal or family bleeding history.  Psychiatric/ Behavioral: denies suicidal ideation, mood changes, confusion, nervousness, sleep disturbance and agitation.  All other systems are reviewed and negative.    PHYSICAL EXAM: VS:  BP 122/82 mmHg  Pulse 71  Ht 5\' 6"  (1.676 m)  Wt 187 lb 6.4 oz (85.004 kg)  BMI 30.26 kg/m2 , BMI Body mass index is 30.26 kg/(m^2). GEN: Well no urished, well developed, in no acute distress HEENT: normal Neck: no JVD, carotid bruits, or masses Cardiac: RRR; no murmurs, rubs, or gallops,no edema  Respiratory:  clear to  auscultation bilaterally, normal work of breathing GI: soft, nontender, nondistended, + BS MS: no deformity or atrophy Skin: warm and dry, no rash Neuro:  Strength and sensation are intact Psych: normal   EKG:  EKG is ordered today. The ekg ordered today demonstrates  NSR at 71.  Inc. RBBB    Recent Labs: No results found for requested labs within last 365 days.    Lipid Panel    Component Value Date/Time   CHOL 136 07/11/2013 0925   CHOL 168 07/11/2012 1009   TRIG 96 07/11/2013 0925   TRIG 78.0 07/11/2012 1009   HDL 57 07/11/2013 0925   HDL 61.10 07/11/2012 1009   CHOLHDL 3 07/11/2012 1009   VLDL 15.6 07/11/2012 1009   LDLCALC 60 07/11/2013 0925   LDLCALC 91 07/11/2012 1009      Wt Readings from Last 3 Encounters:  12/23/14 187 lb 6.4 oz (85.004 kg)  07/11/13 181 lb (82.101 kg)  07/11/12 195 lb 1.9 oz (88.506 kg)      Other studies Reviewed: Additional studies/ records that were reviewed today include: . Review of the above records demonstrates:   ASSESSMENT AND PLAN:  1. Hyperliidemia-  Continue atorvastatin 80 a day. Will repeat labs today I will see her in  1 year for follow up. Encouraged her to continue to work on a diet and exercise program.    Current medicines are reviewed at length with the patient today.  The patient does not have concerns regarding medicines.  The following changes have been made:  no change  Labs/ tests ordered today include:  No orders of the defined types were placed in this encounter.     Disposition:   FU with me in 1 year     Linzi Ohlinger, Deloris PingPhilip J, MD  12/23/2014 10:12 AM    [facility] [address], [place], [place]  [address] Phone: [phone]) [phone]; Fax: [phone]   [facility]  [address] [place], [place]  [address] [phone]    Fax [phone]

## 2014-12-23 NOTE — Patient Instructions (Signed)
Medication Instructions:  Your physician recommends that you continue on your current medications as directed. Please refer to the Current Medication list given to you today.   Labwork: TODAY - cholesterol, liver, bmet  Your physician recommends that you return for lab work in: 1 year on the day of or a few days before your office visit with Dr. Elease HashimotoNahser.  You will need to FAST for this appointment - nothing to eat or drink after midnight the night before except water.   Testing/Procedures: None  Follow-Up: Your physician wants you to follow-up in: 1 year with Dr. Prescott ParmaNahser  You will receive a reminder letter in the mail two months in advance. If you don't receive a letter, please call our office to schedule the follow-up appointment.

## 2015-09-24 LAB — HM COLONOSCOPY

## 2015-12-23 ENCOUNTER — Other Ambulatory Visit (INDEPENDENT_AMBULATORY_CARE_PROVIDER_SITE_OTHER): Payer: BLUE CROSS/BLUE SHIELD | Admitting: *Deleted

## 2015-12-23 DIAGNOSIS — E7800 Pure hypercholesterolemia, unspecified: Secondary | ICD-10-CM

## 2015-12-23 DIAGNOSIS — I10 Essential (primary) hypertension: Secondary | ICD-10-CM

## 2015-12-23 DIAGNOSIS — E785 Hyperlipidemia, unspecified: Secondary | ICD-10-CM

## 2015-12-23 LAB — HEPATIC FUNCTION PANEL
ALT: 17 U/L (ref 6–29)
AST: 20 U/L (ref 10–35)
Albumin: 3.5 g/dL — ABNORMAL LOW (ref 3.6–5.1)
Alkaline Phosphatase: 31 U/L — ABNORMAL LOW (ref 33–130)
BILIRUBIN DIRECT: 0.2 mg/dL (ref <=0.2)
Indirect Bilirubin: 0.6 mg/dL (ref 0.2–1.2)
Total Bilirubin: 0.8 mg/dL (ref 0.2–1.2)
Total Protein: 5.9 g/dL — ABNORMAL LOW (ref 6.1–8.1)

## 2015-12-23 LAB — LIPID PANEL
Cholesterol: 150 mg/dL (ref 125–200)
HDL: 60 mg/dL (ref >=46)
LDL CALC: 74 mg/dL (ref <130)
Total CHOL/HDL Ratio: 2.5 Ratio (ref <=5.0)
Triglycerides: 79 mg/dL (ref <150)
VLDL: 16 mg/dL (ref <30)

## 2015-12-23 LAB — BASIC METABOLIC PANEL
BUN: 18 mg/dL (ref 7–25)
CHLORIDE: 106 mmol/L (ref 98–110)
CO2: 24 mmol/L (ref 20–31)
CREATININE: 0.89 mg/dL (ref 0.50–1.05)
Calcium: 8.6 mg/dL (ref 8.6–10.4)
Glucose, Bld: 91 mg/dL (ref 65–99)
Potassium: 4.8 mmol/L (ref 3.5–5.3)
Sodium: 139 mmol/L (ref 135–146)

## 2016-01-07 ENCOUNTER — Encounter: Payer: Self-pay | Admitting: Cardiovascular Disease

## 2016-01-07 ENCOUNTER — Ambulatory Visit (INDEPENDENT_AMBULATORY_CARE_PROVIDER_SITE_OTHER): Payer: BLUE CROSS/BLUE SHIELD | Admitting: Cardiovascular Disease

## 2016-01-07 VITALS — BP 112/82 | HR 64 | Ht 66.0 in | Wt 190.1 lb

## 2016-01-07 DIAGNOSIS — E785 Hyperlipidemia, unspecified: Secondary | ICD-10-CM | POA: Diagnosis not present

## 2016-01-07 DIAGNOSIS — I341 Nonrheumatic mitral (valve) prolapse: Secondary | ICD-10-CM | POA: Diagnosis not present

## 2016-01-07 NOTE — Progress Notes (Signed)
Cardiology Office Note   Date:  01/07/2016   ID:  Jessica Bradford, DOB 07/29/1966, MRN [id number]  PCP:  Joycelyn Rua, MD  Cardiologist:   Kristeen Miss, MD   1. Hyperliidemia   History of Present Illness:  Jessica Bradford is a 50 year old female with a history of hypercholesterolemia. She has a very strong family history of coronary artery disease. She's done very well since she last saw Dr. Deborah Chalk last year. She walks 4-6 miles every day. She denies any chest pain or shortness of breath with her walking.  She has a history of mitral valve prolapse. She has occasional palpitations. These typically occur when she is at rest.  She is recovering from an episode of cellulitis. She apparently stepped on a splinter on her deck and developed cellulitis in her foot. She was on antibiotics for several weeks. She is still not exercising because of some persistent foot pain and swelling.  Nov. 19, 2014:  No CP, no dyspnea, Still walking 5+ miles a day. Also doing strength training. She gets a discount from her insurance plan to work out    Dec 23, 2014:  Jessica Bradford is a 50 y.o. female who presents for  Follow up of her hyperlipidemia Walks regularly,  Also does strength training.  Trying to watch her diet.   Jan 07, 2016: Doing well  Has had several episodes of waking up "thinking she was not breathing " Snores some but does not have apneic episodes according to husband  Exercising some .   [place] twice a week , walks the other days  Has had difficulty losing weight .   Past Medical History  Diagnosis Date  . Hyperlipidemia   . Incomplete right bundle branch block   . Mitral valve prolapse   . Heart palpitations     Past Surgical History  Procedure Laterality Date  . Cholecystectomy       Current Outpatient Prescriptions  Medication Sig Dispense Refill  . ALPRAZolam (XANAX) 0.5 MG tablet Take 0.5 mg by mouth as needed.      Marland Kitchen aspirin 81 MG tablet Take 81 mg  by mouth daily.      Marland Kitchen atorvastatin (LIPITOR) 80 MG tablet Take 1 tablet (80 mg total) by mouth daily at 6 PM. 90 tablet 3  . LOW-OGESTREL 0.3-30 MG-MCG tablet Take 1 tablet by mouth daily.     . multivitamin (THERAGRAN) per tablet Take 1 tablet by mouth daily.      . sertraline (ZOLOFT) 50 MG tablet Take 1 tablet by mouth daily.  2   No current facility-administered medications for this visit.    Allergies:   Sulfa antibiotics; Doxycycline; and Sulfur    Social History:  The patient  reports that she has never smoked. She does not have any smokeless tobacco history on file.   Family History:  The patient's family history includes Heart attack in her father and mother.    ROS:  Please see the history of present illness.    Review of Systems: Constitutional:  denies fever, chills, diaphoresis, appetite change and fatigue.  HEENT: denies photophobia, eye pain, redness, hearing loss, ear pain, congestion, sore throat, rhinorrhea, sneezing, neck pain, neck stiffness and tinnitus.  Respiratory: denies SOB, DOE, cough, chest tightness, and wheezing.  Cardiovascular: denies chest pain, palpitations and leg swelling.  Gastrointestinal: denies nausea, vomiting, abdominal pain, diarrhea, constipation, blood in stool.  Genitourinary: denies dysuria, urgency, frequency, hematuria, flank pain and difficulty urinating.  Musculoskeletal: denies  myalgias,  back pain, joint swelling, arthralgias and gait problem.   Skin: denies pallor, rash and wound.  Neurological: denies dizziness, seizures, syncope, weakness, light-headedness, numbness and headaches.   Hematological: denies adenopathy, easy bruising, personal or family bleeding history.  Psychiatric/ Behavioral: denies suicidal ideation, mood changes, confusion, nervousness, sleep disturbance and agitation.       All other systems are reviewed and negative.    PHYSICAL EXAM: VS:  BP 112/82 mmHg  Pulse 64  Ht 5\' 6"  (1.676 m)  Wt 190 lb  1.9 oz (86.238 kg)  BMI 30.70 kg/m2 , BMI Body mass index is 30.7 kg/(m^2). GEN: Well nourished, well developed, in no acute distress HEENT: normal Neck: no JVD, carotid bruits, or masses Cardiac: RRR; soft mid systolic click,  no murmurs, rubs, or gallops,no edema  Respiratory:  clear to auscultation bilaterally, normal work of breathing GI: soft, nontender, nondistended, + BS MS: no deformity or atrophy Skin: warm and dry, no rash Neuro:  Strength and sensation are intact Psych: normal   EKG:  EKG is ordered today. The ekg ordered today demonstrates  NSR at 64.   Recent Labs: 12/23/2015: ALT 17; BUN 18; Creat 0.89; Potassium 4.8; Sodium 139    Lipid Panel    Component Value Date/Time   CHOL 150 12/23/2015 0751   CHOL 136 07/11/2013 0925   TRIG 79 12/23/2015 0751   TRIG 96 07/11/2013 0925   HDL 60 12/23/2015 0751   HDL 57 07/11/2013 0925   CHOLHDL 2.5 12/23/2015 0751   VLDL 16 12/23/2015 0751   LDLCALC 74 12/23/2015 0751   LDLCALC 60 07/11/2013 0925      Wt Readings from Last 3 Encounters:  01/07/16 190 lb 1.9 oz (86.238 kg)  12/23/14 187 lb 6.4 oz (85.004 kg)  07/11/13 181 lb (82.101 kg)      Other studies Reviewed: Additional studies/ records that were reviewed today include: . Review of the above records demonstrates:   ASSESSMENT AND PLAN:  1. Hyperliidemia-  Continue atorvastatin 80 a day. Recent labs look good  Will see her in 1 year for OV and repeat labs.   2. MVP - stable   3. Nutrition:   We spent 20 minutes discussing diet and exercise. She has had trouble losing weight  She works out on a regular basis. She eats a fairly good diet. I've advised her to see nutritionist to see if that may be of some benefit.   Current medicines are reviewed at length with the patient today.  The patient does not have concerns regarding medicines.  The following changes have been made:  no change  Labs/ tests ordered today include:  No orders of the defined  types were placed in this encounter.    Disposition:   FU with me in 1 year     Kristeen MissPhilip Leland Staszewski, MD  01/07/2016 8:40 AM    [facility] [address], [place], [place]  [address] Phone: [phone]; Fax: [phone]   [facility]  [address] [place], [place]  [address] [phone]    Fax [phone]

## 2016-01-07 NOTE — Patient Instructions (Signed)

## 2016-03-26 ENCOUNTER — Other Ambulatory Visit: Payer: Self-pay | Admitting: *Deleted

## 2016-03-26 MED ORDER — ATORVASTATIN CALCIUM 80 MG PO TABS: 80.0000 mg | ORAL_TABLET | Freq: Every day | ORAL | 3 refills | Status: DC

## 2016-04-09 DIAGNOSIS — R300 Dysuria: Secondary | ICD-10-CM | POA: Diagnosis not present

## 2016-04-28 DIAGNOSIS — R1909 Other intra-abdominal and pelvic swelling, mass and lump: Secondary | ICD-10-CM | POA: Diagnosis not present

## 2016-04-28 DIAGNOSIS — L739 Follicular disorder, unspecified: Secondary | ICD-10-CM | POA: Diagnosis not present

## 2016-06-09 DIAGNOSIS — Z23 Encounter for immunization: Secondary | ICD-10-CM | POA: Diagnosis not present

## 2016-07-26 DIAGNOSIS — Z124 Encounter for screening for malignant neoplasm of cervix: Secondary | ICD-10-CM | POA: Diagnosis not present

## 2016-07-26 DIAGNOSIS — Z6833 Body mass index (BMI) 33.0-33.9, adult: Secondary | ICD-10-CM | POA: Diagnosis not present

## 2016-07-26 DIAGNOSIS — Z1231 Encounter for screening mammogram for malignant neoplasm of breast: Secondary | ICD-10-CM | POA: Diagnosis not present

## 2016-07-26 DIAGNOSIS — Z01419 Encounter for gynecological examination (general) (routine) without abnormal findings: Secondary | ICD-10-CM | POA: Diagnosis not present

## 2016-08-25 ENCOUNTER — Encounter: Payer: Self-pay | Admitting: Obstetrics and Gynecology

## 2016-08-25 ENCOUNTER — Ambulatory Visit (INDEPENDENT_AMBULATORY_CARE_PROVIDER_SITE_OTHER): Payer: BLUE CROSS/BLUE SHIELD | Admitting: Obstetrics and Gynecology

## 2016-08-25 VITALS — BP 116/80 | HR 70 | Resp 18 | Ht 66.0 in | Wt 211.0 lb

## 2016-08-25 DIAGNOSIS — N8110 Cystocele, unspecified: Secondary | ICD-10-CM | POA: Diagnosis not present

## 2016-08-25 DIAGNOSIS — R32 Unspecified urinary incontinence: Secondary | ICD-10-CM

## 2016-08-25 DIAGNOSIS — N3946 Mixed incontinence: Secondary | ICD-10-CM | POA: Diagnosis not present

## 2016-08-25 LAB — POCT URINALYSIS DIPSTICK
Bilirubin, UA: NEGATIVE
Glucose, UA: NEGATIVE
KETONES UA: NEGATIVE
LEUKOCYTES UA: NEGATIVE
Nitrite, UA: NEGATIVE
PH UA: 6
PROTEIN UA: NEGATIVE
UROBILINOGEN UA: NEGATIVE

## 2016-08-25 NOTE — Progress Notes (Signed)
GYNECOLOGY  VISIT   HPI: 51 y.o.   Married  Caucasian  female   G2P2002 with Patient's last menstrual period was 08/22/2016.   here for New patient c/o incontinence.  Leak with sneeze, cough, jump rope, exercise, or wiping.  Had urinary frequency and now on Ditropan XL for about 6 months.  Was voiding 12 times a day.  Can still get up 2 - 4 times per night. Usually is up once per night.  The four times per night is unusual.  Wakes up and sheets are wet.  Voids well, but is actually double voiding.  Has normal BMs and uses Benifiber.  If strains has hemorrhoids and has had thrombosis.  Colonoscopy normal in Feb 2017.  Sometimes feels a vaginal bulge.  Sexually active and no problems with pain or discomfort.  Hx pyelonephritis as a teen.  Had IVP done and this was normal.  Has had trace of blood sometimes with urine testing.  No evaluation for hematuria.  Hx UTIs.  Last was beginning of 2017.   No prior evaluation.   1 coffee per day.  1 soda per day.  Lemon in her water.   Both children were 10 pounds.  Had forceps and vacuum with first delivery.  Mother died of heart disease at age 67.  Records in Dr. Mechele Collin office.  Pt does not remember diagnosis.  Patient sees cardiology herself.   Takes birth control for ovarian cyst control.  Has had BTL.  Daughter marrying on March 21.   Referred by Dr. Aldona Bar.   GYNECOLOGIC HISTORY: Patient's last menstrual period was 08/22/2016. Contraception:  Tubal Ligation  Menopausal hormone therapy:  None Last mammogram:  07/26/16 Normal  Last pap smear:   07/26/16 Normal         OB History    Gravida Para Term Preterm AB Living   2 2 2     2    SAB TAB Ectopic Multiple Live Births           2         Patient Active Problem List   Diagnosis Date Noted  . MVP (mitral valve prolapse) 07/06/2011  . Hyperlipidemia 07/06/2011    Past Medical History:  Diagnosis Date  . Anxiety   . Heart palpitations   . Hyperlipidemia   .  Incomplete right bundle branch block   . Mitral valve prolapse   . Urinary incontinence     Past Surgical History:  Procedure Laterality Date  . CHOLECYSTECTOMY    . TUBAL LIGATION  1996    Current Outpatient Prescriptions  Medication Sig Dispense Refill  . ALPRAZolam (XANAX) 0.25 MG tablet Take 0.5 tablets by mouth daily as needed.  1  . aspirin 81 MG tablet Take 81 mg by mouth daily.      Marland Kitchen atorvastatin (LIPITOR) 80 MG tablet Take 1 tablet (80 mg total) by mouth daily at 6 PM. 90 tablet 3  . LOW-OGESTREL 0.3-30 MG-MCG tablet Take 1 tablet by mouth daily.     . multivitamin (THERAGRAN) per tablet Take 1 tablet by mouth daily.      Marland Kitchen oxybutynin (DITROPAN XL) 15 MG 24 hr tablet Take 1 tablet by mouth daily.  3  . sertraline (ZOLOFT) 50 MG tablet Take 1 tablet by mouth daily.  2   No current facility-administered medications for this visit.      ALLERGIES: Sulfa antibiotics; Doxycycline; and Sulfur  Family History  Problem Relation Age of Onset  . Heart  attack Father   . Heart attack Mother     Social History   Social History  . Marital status: Married    Spouse name: N/A  . Number of children: N/A  . Years of education: N/A   Occupational History  . Not on file.   Social History Main Topics  . Smoking status: Never Smoker  . Smokeless tobacco: Never Used  . Alcohol use 1.2 oz/week    2 Cans of beer per week  . Drug use: No  . Sexual activity: Yes    Birth control/ protection: Surgical   Other Topics Concern  . Not on file   Social History Narrative  . No narrative on file    ROS:  Pertinent items are noted in HPI.  PHYSICAL EXAMINATION:    BP 116/80 (BP Location: Right Arm, Patient Position: Sitting, Cuff Size: Large)   Pulse 70   Resp 18   Ht 5\' 6"  (1.676 m)   Wt 211 lb (95.7 kg)   LMP 08/22/2016   BMI 34.06 kg/m     General appearance: alert, cooperative and appears stated age Head: Normocephalic, without obvious abnormality, atraumatic Neck:  no adenopathy, supple, symmetrical, trachea midline and thyroid normal to inspection and palpation Lungs: clear to auscultation bilaterally Heart: regular rate and rhythm Abdomen: soft, non-tender, no masses,  no organomegaly Extremities: extremities normal, atraumatic, no cyanosis or edema Skin: Skin color, texture, turgor normal. No rashes or lesions No abnormal inguinal nodes palpated Neurologic: Grossly normal  Pelvic: External genitalia:  no lesions              Urethra:  normal appearing urethra with no masses, tenderness or lesions              Bartholins and Skenes: normal                 Vagina: normal appearing vagina with normal color and discharge, no lesions, first degree cystocele, minimal rectocele.  Good uterine support.  Patient examined in lithotomy and standing positions.              Cervix: no lesions                Bimanual Exam:  Uterus:  normal size, contour, position, consistency, mobility, non-tender              Adnexa: no mass, fullness, tenderness              Rectal exam: Yes.  .  Confirms.              Anus:  normal sphincter tone, no lesions  Chaperone was present for exam.  ASSESSMENT  Mixed incontinence.  Cystocele.  Status post BTL.  On combined OCPs for ovarian cyst suppression.   PLAN  Discussion of urinary incontinence and pelvic organ prolapse - overactive bladder, stress incontinence, and cystocele.  Risks factors, general trend of progression, and possible post op recurrence explained. We discussed medication for her overactive bladder, which is improved on Ditropan XL. Dietary irritants reviewed. We discussed options for the stress incontinence - pessary use, Impressaa, physical therapy, and surgical correction of cystocele and placement of a midurethral sling. Midurethral sling reviewed in detail.  Risks may include but are not limited to mesh erosion/exposure requiring vaginal estrogen or reoperation, urinary retention, cystotomy,  increased urinary urgency, slower voiding, and urinary tract infections. Urodynamic testing is recommended prior to surgical care, and patient desires to proceed forward with this. Considering surgery  for spring or summer.   An After Visit Summary was printed and given to the patient.  _60_____ minutes face to face time of which over 50% was spent in counseling.

## 2016-10-11 ENCOUNTER — Ambulatory Visit (INDEPENDENT_AMBULATORY_CARE_PROVIDER_SITE_OTHER): Payer: BLUE CROSS/BLUE SHIELD

## 2016-10-11 VITALS — BP 102/70 | HR 76 | Resp 16 | Ht 66.0 in | Wt 211.0 lb

## 2016-10-11 DIAGNOSIS — Z01812 Encounter for preprocedural laboratory examination: Secondary | ICD-10-CM | POA: Diagnosis not present

## 2016-10-11 LAB — POCT URINALYSIS DIPSTICK
Bilirubin, UA: NEGATIVE
Blood, UA: NEGATIVE
Glucose, UA: NEGATIVE
Ketones, UA: NEGATIVE
Leukocytes, UA: NEGATIVE
Nitrite, UA: NEGATIVE
PROTEIN UA: NEGATIVE
UROBILINOGEN UA: NEGATIVE
pH, UA: 5

## 2016-10-11 NOTE — Progress Notes (Signed)
Patient in office for a UA for urodynamics. Urine was negative.  Encounter closed and routed to provider.

## 2016-10-20 ENCOUNTER — Ambulatory Visit (INDEPENDENT_AMBULATORY_CARE_PROVIDER_SITE_OTHER): Payer: BLUE CROSS/BLUE SHIELD | Admitting: *Deleted

## 2016-10-20 DIAGNOSIS — N3946 Mixed incontinence: Secondary | ICD-10-CM

## 2016-10-20 NOTE — Progress Notes (Signed)
Jessica Bradford is a 51 y.o. female Who presents today for urodynamics testing, ordered by Dr. Edward JollySilva.   Allergies and medications reviewed.  Denies complaints today. No urinary complaints.   Urine Micro exam: negative for WBC's or RBC's, okay to proceed per Dr. Edward JollySilva.  Patient reports urinary leakage with coughing, sneezing, exercise, jump rope, and wiping.   Urodynamics testing initiated. Lumax Bladder Catheter #10 [place] and lumax Abdominal Catheter #10 [place].   Post void residual 90 ml.   Urethral catheter placed without issue. Rectal catheter placed without issue.   Urodynamics testing completed. Please see scanned Patient summary report in Epic. Procedure completed and patient tolerated well without complaints. Patient scheduled for follow up office visit with Dr. Edward JollySilva to discuss results. Patient agreeable.   Patient given post procedure instructions:  You may have a mild bladder and rectal discomfort for a few hours after the test. You may experience some frequent urination and slight burning the first few times you urinate after the test. Rarely, the urine may be blood tinged. These are both due to catheter placements and resolve quickly. You should call our office immediately if you have signs of infection, which may include bladder pain, urinary urgency, fever, or burning during urination. We do encourage you to drink plenty of water after the test.

## 2016-10-20 NOTE — Progress Notes (Signed)
Multichannel urodynamic testing performed.  Stopped Ditropan Xl 15 mg one week prior to testing.  Uroflow - void 272 cc, PVR 90 cc.  Continuous.  CMG - S1 132 cc, S2 152 cc, S3 160 cc.  VLPP 83 cm H20 at 193 cc.  Unstable bladder.  UPP - 17 cm H20.  Pressure flow study - Pdet max 44 cm H20, void 487 cc. Abdominal straining to void.   Stress incontinence.  Overactive bladder.  Low sensations with bladder filling.

## 2016-10-27 ENCOUNTER — Encounter: Payer: Self-pay | Admitting: Obstetrics and Gynecology

## 2016-10-27 ENCOUNTER — Ambulatory Visit (INDEPENDENT_AMBULATORY_CARE_PROVIDER_SITE_OTHER): Payer: BLUE CROSS/BLUE SHIELD | Admitting: Obstetrics and Gynecology

## 2016-10-27 VITALS — BP 100/60 | HR 64 | Resp 16 | Ht 66.0 in | Wt 206.2 lb

## 2016-10-27 DIAGNOSIS — N8110 Cystocele, unspecified: Secondary | ICD-10-CM | POA: Diagnosis not present

## 2016-10-27 DIAGNOSIS — N3946 Mixed incontinence: Secondary | ICD-10-CM

## 2016-10-27 NOTE — Progress Notes (Signed)
GYNECOLOGY  VISIT   HPI: 51 y.o.   Married  Caucasian  female   G2P2002 with Patient's last menstrual period was 10/16/2016.   here to discuss urodynamics results.   States she thinks she is not really for surgical care.  Leaks with sneeze, cough, exercise.  Usually takes Ditropan XL.  This really helps urgency and frequency.  Has first degree cystocele. She can feel heaviness and knows she will leak if her bladder is full.   Multichannel urodynamic testing performed 10/20/16: Stopped Ditropan XL 15 mg one week prior to testing.  Uroflow - void 272 cc, PVR 90 cc.  Continuous.  CMG - S1 132 cc, S2 152 cc, S3 160 cc.  VLPP 83 cm H20 at 193 cc.  Unstable bladder.  UPP - 17 cm H20.  Pressure flow study - Pdet max 44 cm H20, void 487 cc. Abdominal straining to void.   Stress incontinence.  Overactive bladder.  Low sensations with bladder filling.   GYNECOLOGIC HISTORY: Patient's last menstrual period was 10/16/2016. Contraception:  Tubal Menopausal hormone therapy:  none Last mammogram: 07-26-16 normal per patient  Last pap smear:   07-26-16 normal per patient        OB History    Gravida Para Term Preterm AB Living   2 2 2     2    SAB TAB Ectopic Multiple Live Births           2         Patient Active Problem List   Diagnosis Date Noted  . MVP (mitral valve prolapse) 07/06/2011  . Hyperlipidemia 07/06/2011    Past Medical History:  Diagnosis Date  . Anxiety   . Heart palpitations   . Hyperlipidemia   . Incomplete right bundle branch block   . Mitral valve prolapse   . Urinary incontinence     Past Surgical History:  Procedure Laterality Date  . CHOLECYSTECTOMY    . TUBAL LIGATION  1996    Current Outpatient Prescriptions  Medication Sig Dispense Refill  . ALPRAZolam (XANAX) 0.25 MG tablet Take 0.5 tablets by mouth daily as needed.  1  . aspirin 81 MG tablet Take 81 mg by mouth daily.      Marland Kitchen. atorvastatin (LIPITOR) 80 MG tablet Take 1 tablet (80 mg total)  by mouth daily at 6 PM. 90 tablet 3  . LOW-OGESTREL 0.3-30 MG-MCG tablet Take 1 tablet by mouth daily.     . multivitamin (THERAGRAN) per tablet Take 1 tablet by mouth daily.      Marland Kitchen. oxybutynin (DITROPAN XL) 15 MG 24 hr tablet Take 1 tablet by mouth daily.  3  . sertraline (ZOLOFT) 50 MG tablet Take 1 tablet by mouth daily.  2   No current facility-administered medications for this visit.      ALLERGIES: Sulfa antibiotics; Doxycycline; and Sulfur  Family History  Problem Relation Age of Onset  . Heart attack Father   . Heart attack Mother     Social History   Social History  . Marital status: Married    Spouse name: N/A  . Number of children: N/A  . Years of education: N/A   Occupational History  . Not on file.   Social History Main Topics  . Smoking status: Never Smoker  . Smokeless tobacco: Never Used  . Alcohol use 1.2 oz/week    2 Cans of beer per week  . Drug use: No  . Sexual activity: Yes    Birth control/  protection: Surgical   Other Topics Concern  . Not on file   Social History Narrative  . No narrative on file    ROS:  Pertinent items are noted in HPI.  PHYSICAL EXAMINATION:    BP 100/60 (BP Location: Right Arm, Patient Position: Sitting, Cuff Size: Normal)   Pulse 64   Resp 16   Ht 5\' 6"  (1.676 m)   Wt 206 lb 3.2 oz (93.5 kg)   LMP 10/16/2016   BMI 33.28 kg/m     General appearance: alert, cooperative and appears stated age   ASSESSMENT  Mixed incontinence.  Cystocele.   PLAN  Discussion of mixed incontinence - etiologies and treatment options including medical and surgical care. Focused discussion of midurethral sling, cystoscopy, and anterior colporrhaphy. We discussed permanent mesh materials which are currently the most widely used for this procedure.  I discussed slings being 85 - 90% effective in treatment of stress incontinence.  I discussed surgical risks of slings including but not limited to erosions and exposure, dyspareunia,  cystotomy, urinary retention and slower voiding, increase in urgency symptoms, need for prolonged catheterization or self catheterization, urinary tract infections, bleeding, infection, damage to surrounding organs, reaction to anesthesia, DVT, PE, death, need for reoperation, and recurrence of incontinence.  Patient wishes to proceed with referral to physical therapy, and I will place this referral to Ruben Gottron at [facility]. Continue Ditropan at current dosage. She would like to follow up here in 3 months to discuss if she is meeting her treatment goals.   An After Visit Summary was printed and given to the patient.  ___25___ minutes face to face time of which over 50% was spent in counseling.

## 2016-10-28 ENCOUNTER — Encounter: Payer: Self-pay | Admitting: Obstetrics and Gynecology

## 2016-11-02 ENCOUNTER — Telehealth: Payer: Self-pay | Admitting: Obstetrics and Gynecology

## 2016-11-02 NOTE — Telephone Encounter (Signed)
Patient is calling for the status of her urology referral.

## 2016-11-03 NOTE — Telephone Encounter (Signed)
Call to [facility] regarding appointment. Their scheduler stated they attempted to call patient yesterday and left voicemail to call for scheduling. Requested they call again and notify our office if unable to reach.

## 2016-11-29 DIAGNOSIS — M6281 Muscle weakness (generalized): Secondary | ICD-10-CM | POA: Diagnosis not present

## 2016-11-29 DIAGNOSIS — R278 Other lack of coordination: Secondary | ICD-10-CM | POA: Diagnosis not present

## 2016-11-29 DIAGNOSIS — N3941 Urge incontinence: Secondary | ICD-10-CM | POA: Diagnosis not present

## 2016-11-29 DIAGNOSIS — N393 Stress incontinence (female) (male): Secondary | ICD-10-CM | POA: Diagnosis not present

## 2016-12-02 DIAGNOSIS — J029 Acute pharyngitis, unspecified: Secondary | ICD-10-CM | POA: Diagnosis not present

## 2016-12-10 DIAGNOSIS — M62838 Other muscle spasm: Secondary | ICD-10-CM | POA: Diagnosis not present

## 2016-12-10 DIAGNOSIS — N3946 Mixed incontinence: Secondary | ICD-10-CM | POA: Diagnosis not present

## 2016-12-10 DIAGNOSIS — M6281 Muscle weakness (generalized): Secondary | ICD-10-CM | POA: Diagnosis not present

## 2016-12-10 DIAGNOSIS — N3944 Nocturnal enuresis: Secondary | ICD-10-CM | POA: Diagnosis not present

## 2016-12-16 DIAGNOSIS — M62838 Other muscle spasm: Secondary | ICD-10-CM | POA: Diagnosis not present

## 2016-12-16 DIAGNOSIS — M6281 Muscle weakness (generalized): Secondary | ICD-10-CM | POA: Diagnosis not present

## 2016-12-16 DIAGNOSIS — N3946 Mixed incontinence: Secondary | ICD-10-CM | POA: Diagnosis not present

## 2016-12-16 DIAGNOSIS — N3944 Nocturnal enuresis: Secondary | ICD-10-CM | POA: Diagnosis not present

## 2016-12-24 DIAGNOSIS — S93401A Sprain of unspecified ligament of right ankle, initial encounter: Secondary | ICD-10-CM | POA: Diagnosis not present

## 2016-12-28 DIAGNOSIS — N3944 Nocturnal enuresis: Secondary | ICD-10-CM | POA: Diagnosis not present

## 2016-12-28 DIAGNOSIS — M62838 Other muscle spasm: Secondary | ICD-10-CM | POA: Diagnosis not present

## 2016-12-28 DIAGNOSIS — M6281 Muscle weakness (generalized): Secondary | ICD-10-CM | POA: Diagnosis not present

## 2016-12-28 DIAGNOSIS — N3946 Mixed incontinence: Secondary | ICD-10-CM | POA: Diagnosis not present

## 2017-01-04 DIAGNOSIS — Z0000 Encounter for general adult medical examination without abnormal findings: Secondary | ICD-10-CM | POA: Diagnosis not present

## 2017-01-19 ENCOUNTER — Other Ambulatory Visit: Payer: BLUE CROSS/BLUE SHIELD | Admitting: *Deleted

## 2017-01-19 DIAGNOSIS — I341 Nonrheumatic mitral (valve) prolapse: Secondary | ICD-10-CM

## 2017-01-19 DIAGNOSIS — E782 Mixed hyperlipidemia: Secondary | ICD-10-CM | POA: Diagnosis not present

## 2017-01-19 LAB — COMPREHENSIVE METABOLIC PANEL
A/G RATIO: 1.7 (ref 1.2–2.2)
ALBUMIN: 4 g/dL (ref 3.5–5.5)
ALT: 14 IU/L (ref 0–32)
AST: 20 IU/L (ref 0–40)
Alkaline Phosphatase: 37 IU/L — ABNORMAL LOW (ref 39–117)
BILIRUBIN TOTAL: 0.9 mg/dL (ref 0.0–1.2)
BUN / CREAT RATIO: 17 (ref 9–23)
BUN: 16 mg/dL (ref 6–24)
CHLORIDE: 100 mmol/L (ref 96–106)
CO2: 21 mmol/L (ref 18–29)
Calcium: 9.2 mg/dL (ref 8.7–10.2)
Creatinine, Ser: 0.95 mg/dL (ref 0.57–1.00)
GFR calc non Af Amer: 69 mL/min/{1.73_m2} (ref >59)
GFR, EST AFRICAN AMERICAN: 80 mL/min/{1.73_m2} (ref >59)
GLOBULIN, TOTAL: 2.4 g/dL (ref 1.5–4.5)
Glucose: 92 mg/dL (ref 65–99)
POTASSIUM: 4.4 mmol/L (ref 3.5–5.2)
Sodium: 136 mmol/L (ref 134–144)
TOTAL PROTEIN: 6.4 g/dL (ref 6.0–8.5)

## 2017-01-20 ENCOUNTER — Telehealth: Payer: Self-pay | Admitting: Cardiovascular Disease

## 2017-01-20 DIAGNOSIS — E782 Mixed hyperlipidemia: Secondary | ICD-10-CM

## 2017-01-20 NOTE — Telephone Encounter (Signed)
Lipid profile was not completed during patient's lab appointment yesterday. I spoke with Katrina in our lab who states if order is put into epic, she will print and forward to [facility] to add on to blood that was drawn yesterday.  I spoke with patient and explained and apologized for the issue. She states she can come in tomorrow morning if more blood is needed. Her appointment with Dr. Elease HashimotoNahser is on 6/6. I advised her that I will call her back if additional blood is needed. She thanked me for the call.

## 2017-01-20 NOTE — Telephone Encounter (Signed)
New message     Pt is calling regarding her lab work , she did not see results for lipid on my chart

## 2017-01-21 DIAGNOSIS — M62838 Other muscle spasm: Secondary | ICD-10-CM | POA: Diagnosis not present

## 2017-01-21 DIAGNOSIS — N3944 Nocturnal enuresis: Secondary | ICD-10-CM | POA: Diagnosis not present

## 2017-01-21 DIAGNOSIS — M6281 Muscle weakness (generalized): Secondary | ICD-10-CM | POA: Diagnosis not present

## 2017-01-21 DIAGNOSIS — N3946 Mixed incontinence: Secondary | ICD-10-CM | POA: Diagnosis not present

## 2017-01-26 ENCOUNTER — Ambulatory Visit (INDEPENDENT_AMBULATORY_CARE_PROVIDER_SITE_OTHER): Payer: BLUE CROSS/BLUE SHIELD | Admitting: Cardiovascular Disease

## 2017-01-26 ENCOUNTER — Encounter: Payer: Self-pay | Admitting: Cardiovascular Disease

## 2017-01-26 VITALS — BP 122/78 | HR 59 | Ht 66.0 in | Wt 206.5 lb

## 2017-01-26 DIAGNOSIS — R5383 Other fatigue: Secondary | ICD-10-CM | POA: Diagnosis not present

## 2017-01-26 LAB — TSH: TSH: 2.15 u[IU]/mL (ref 0.450–4.500)

## 2017-01-26 NOTE — Patient Instructions (Signed)
Medication Instructions:  Your physician recommends that you continue on your current medications as directed. Please refer to the Current Medication list given to you today.   Labwork: TODAY - TSH   Testing/Procedures: None Ordered   Follow-Up: Your physician wants you to follow-up in: 1 year with Dr. Elease HashimotoNahser.  You will receive a reminder letter in the mail two months in advance. If you don't receive a letter, please call our office to schedule the follow-up appointment.   If you need a refill on your cardiac medications before your next appointment, please call your pharmacy.   Thank you for choosing [facility]! Eligha BridegroomMichelle Swinyer, RN [phone]

## 2017-01-26 NOTE — Progress Notes (Signed)
Cardiology Office Note   Date:  01/26/2017   ID:  Jessica Bradford, DOB [birth year], MRN [id number]  PCP:  Joycelyn Rua, MD  Cardiologist:   Kristeen Miss, MD   1. Hyperliidemia 2.   History of Present Illness:  Jessica Bradford is a 51 year old female with a history of hypercholesterolemia. She has a very strong family history of coronary artery disease. She's done very well since she last saw Dr. Deborah Chalk last year. She walks 4-6 miles every day. She denies any chest pain or shortness of breath with her walking.  She has a history of mitral valve prolapse. She has occasional palpitations. These typically occur when she is at rest.  She is recovering from an episode of cellulitis. She apparently stepped on a splinter on her deck and developed cellulitis in her foot. She was on antibiotics for several weeks. She is still not exercising because of some persistent foot pain and swelling.  Nov. 19, 2014:  No CP, no dyspnea, Still walking 5+ miles a day. Also doing strength training. She gets a discount from her insurance plan to work out    Dec 23, 2014:  Jessica Bradford is a 51 y.o. female who presents for  Follow up of her hyperlipidemia Walks regularly,  Also does strength training.  Trying to watch her diet.   Jan 07, 2016: Doing well  Has had several episodes of waking up "thinking she was not breathing " Snores some but does not have apneic episodes according to husband  Exercising some .   [place] twice a week , walks the other days  Has had difficulty losing weight .   January 26, 2017:  Jessica Bradford is doing well from a cardiac standpoint Is very fatigued for the past 6 months . Is tired all of the time  Takes naps frequently  Still working out at [employer] - works outs are going ok .     Past Medical History:  Diagnosis Date  . Anxiety   . Heart palpitations   . Hyperlipidemia   . Incomplete right bundle branch block   . Mitral valve prolapse   . Urinary incontinence      Past Surgical History:  Procedure Laterality Date  . CHOLECYSTECTOMY    . TUBAL LIGATION  1996     Current Outpatient Prescriptions  Medication Sig Dispense Refill  . ALPRAZolam (XANAX) 0.25 MG tablet Take 0.5 tablets by mouth daily as needed.  1  . aspirin 81 MG tablet Take 81 mg by mouth daily.      Marland Kitchen atorvastatin (LIPITOR) 80 MG tablet Take 1 tablet (80 mg total) by mouth daily at 6 PM. 90 tablet 3  . LOW-OGESTREL 0.3-30 MG-MCG tablet Take 1 tablet by mouth daily.     . multivitamin (THERAGRAN) per tablet Take 1 tablet by mouth daily.      Marland Kitchen oxybutynin (DITROPAN XL) 15 MG 24 hr tablet Take 1 tablet by mouth daily.  3  . sertraline (ZOLOFT) 50 MG tablet Take 1 tablet by mouth daily.  2   No current facility-administered medications for this visit.     Allergies:   Sulfa antibiotics; Doxycycline; and Sulfur    Social History:  The patient  reports that she has never smoked. She has never used smokeless tobacco. She reports that she drinks about 1.2 oz of alcohol per week . She reports that she does not use drugs.   Family History:  The patient's family history includes Heart attack in her father  and mother.    ROS:  Please see the history of present illness.    Review of Systems: Constitutional:  denies fever, chills, diaphoresis, appetite change and fatigue.  HEENT: denies photophobia, eye pain, redness, hearing loss, ear pain, congestion, sore throat, rhinorrhea, sneezing, neck pain, neck stiffness and tinnitus.  Respiratory: denies SOB, DOE, cough, chest tightness, and wheezing.  Cardiovascular: denies chest pain, palpitations and leg swelling.  Gastrointestinal: denies nausea, vomiting, abdominal pain, diarrhea, constipation, blood in stool.  Genitourinary: denies dysuria, urgency, frequency, hematuria, flank pain and difficulty urinating.  Musculoskeletal: denies  myalgias, back pain, joint swelling, arthralgias and gait problem.   Skin: denies pallor, rash and  wound.  Neurological: denies dizziness, seizures, syncope, weakness, light-headedness, numbness and headaches.   Hematological: denies adenopathy, easy bruising, personal or family bleeding history.  Psychiatric/ Behavioral: denies suicidal ideation, mood changes, confusion, nervousness, sleep disturbance and agitation.       All other systems are reviewed and negative.    PHYSICAL EXAM: VS:  BP 122/78   Pulse (!) 59   Ht 5\' 6"  (1.676 m)   Wt 206 lb 8 oz (93.7 kg)   SpO2 97%   BMI 33.33 kg/m  , BMI Body mass index is 33.33 kg/m. GEN: Well nourished, well developed, in no acute distress  HEENT: normal  Neck: no JVD, carotid bruits, or masses Cardiac: RRR; soft mid systolic click,  no murmurs, rubs, or gallops,no edema  Respiratory:  clear to auscultation bilaterally, normal work of breathing GI: soft, nontender, nondistended, + BS MS: right lateral aspect of her ankle  is swolled ( recent sprain) Skin: warm and dry, no rash Neuro:  Strength and sensation are intact Psych: normal   EKG:  EKG is ordered today. January 26, 2017 - sinus brady 59.   Inc. RBBB    Recent Labs: 01/19/2017: ALT 14; BUN 16; Creatinine, Ser 0.95; Potassium 4.4; Sodium 136    Lipid Panel    Component Value Date/Time   CHOL 174 01/19/2017 0837   CHOL 136 07/11/2013 0925   TRIG 136 01/19/2017 0837   TRIG 96 07/11/2013 0925   HDL 58 01/19/2017 0837   HDL 57 07/11/2013 0925   CHOLHDL 3.0 01/19/2017 0837   CHOLHDL 2.5 12/23/2015 0751   VLDL 16 12/23/2015 0751   LDLCALC 89 01/19/2017 0837   LDLCALC 60 07/11/2013 0925      Wt Readings from Last 3 Encounters:  01/26/17 206 lb 8 oz (93.7 kg)  10/27/16 206 lb 3.2 oz (93.5 kg)  10/20/16 206 lb 6.4 oz (93.6 kg)      Other studies Reviewed: Additional studies/ records that were reviewed today include: . Review of the above records demonstrates:   ASSESSMENT AND PLAN:  1. Hyperliidemia-  Continue atorvastatin 80 a day. Recent labs look good    Will see her in 1 year for OV and repeat labs.   2.  Fatigue :   Will check a TSH  Advised her to check in with her medical doctor     Current medicines are reviewed at length with the patient today.  The patient does not have concerns regarding medicines.  The following changes have been made:  no change  Labs/ tests ordered today include:  No orders of the defined types were placed in this encounter.   Disposition:   FU with me in 1 year     Kristeen MissPhilip Gioia Ranes, MD  01/26/2017 10:02 AM    [facility] [id number], [place], [place]  [address] Phone: [phone]; Fax: [phone]

## 2017-01-28 ENCOUNTER — Encounter: Payer: Self-pay | Admitting: Obstetrics and Gynecology

## 2017-01-28 ENCOUNTER — Ambulatory Visit (INDEPENDENT_AMBULATORY_CARE_PROVIDER_SITE_OTHER): Payer: BLUE CROSS/BLUE SHIELD | Admitting: Obstetrics and Gynecology

## 2017-01-28 VITALS — BP 110/60 | HR 76 | Resp 16 | Wt 207.0 lb

## 2017-01-28 DIAGNOSIS — N3946 Mixed incontinence: Secondary | ICD-10-CM

## 2017-01-28 DIAGNOSIS — N8110 Cystocele, unspecified: Secondary | ICD-10-CM | POA: Diagnosis not present

## 2017-01-28 NOTE — Progress Notes (Signed)
GYNECOLOGY  VISIT   HPI: 51 y.o.   Married  Caucasian  female   G2P2002 with Patient's last menstrual period was 12/30/2016.   here for 3 month recheck.  Has mixed incontinence.  She also has a first cystocele and a minimal rectocele.  Takes Ditropan XL 15 mg daily.   She did PT with [facility].  Getting a good result and is really pleased. Not having as much leakage. Not leaking at the gym! Some days the bladder feels more heavy. This has nothing to do with her period. Not really having any bowel issues but is now using a squatty potty so she does not strain.   She is also doing PT for her leg.  Has appointment to see Dr. Aldona BarWein in December 2018 for a routine check up.   GYNECOLOGIC HISTORY: Patient's last menstrual period was 12/30/2016. Contraception:  Tubal Menopausal hormone therapy:  none Last mammogram:  07-26-16 normal per patient  Last pap smear:   07/26/16 normal per patient        OB History    Gravida Para Term Preterm AB Living   2 2 2     2    SAB TAB Ectopic Multiple Live Births           2         Patient Active Problem List   Diagnosis Date Noted  . MVP (mitral valve prolapse) 07/06/2011  . Hyperlipidemia 07/06/2011    Past Medical History:  Diagnosis Date  . Anxiety   . Heart palpitations   . Hyperlipidemia   . Incomplete right bundle branch block   . Mitral valve prolapse   . Urinary incontinence     Past Surgical History:  Procedure Laterality Date  . CHOLECYSTECTOMY    . TUBAL LIGATION  1996    Current Outpatient Prescriptions  Medication Sig Dispense Refill  . ALPRAZolam (XANAX) 0.25 MG tablet Take 0.5 tablets by mouth daily as needed.  1  . aspirin 81 MG tablet Take 81 mg by mouth daily.      Marland Kitchen. atorvastatin (LIPITOR) 80 MG tablet Take 1 tablet (80 mg total) by mouth daily at 6 PM. 90 tablet 3  . LOW-OGESTREL 0.3-30 MG-MCG tablet Take 1 tablet by mouth daily.     . multivitamin (THERAGRAN) per tablet Take 1 tablet by mouth daily.       Marland Kitchen. oxybutynin (DITROPAN XL) 15 MG 24 hr tablet Take 1 tablet by mouth daily.  3  . sertraline (ZOLOFT) 50 MG tablet Take 1 tablet by mouth daily.  2   No current facility-administered medications for this visit.      ALLERGIES: Sulfa antibiotics; Doxycycline; and Sulfur  Family History  Problem Relation Age of Onset  . Heart attack Father   . Heart attack Mother     Social History   Social History  . Marital status: Married    Spouse name: N/A  . Number of children: N/A  . Years of education: N/A   Occupational History  . Not on file.   Social History Main Topics  . Smoking status: Never Smoker  . Smokeless tobacco: Never Used  . Alcohol use 1.2 oz/week    2 Cans of beer per week  . Drug use: No  . Sexual activity: Yes    Birth control/ protection: Surgical   Other Topics Concern  . Not on file   Social History Narrative  . No narrative on file    ROS:  Pertinent items are noted in HPI.  PHYSICAL EXAMINATION:    BP 110/60 (BP Location: Right Arm, Patient Position: Sitting, Cuff Size: Large)   Pulse 76   Resp 16   Wt 207 lb (93.9 kg)   LMP 12/30/2016   BMI 33.41 kg/m     General appearance: alert, cooperative and appears stated age   ASSESSMENT  Mixed incontinence.  On Ditropan XL. Bladder prolapse.   PLAN  Finish course of PT.  Declines surgical intervention at this time, and I support this choice! Continue Ditropan XL.  Follow up with Dr. Aldona Bar for routine care at the end of this year.  Return for increased incontinence, difficulty voiding, or vaginal bulge.   An After Visit Summary was printed and given to the patient.  ___15_ minutes face to face time of which over 50% was spent in counseling.

## 2017-01-28 NOTE — Addendum Note (Signed)
Addended byEtheleen Mayhew: Abhijay Morriss on: 01/28/2017 08:44 AM   Modules accepted: Orders

## 2017-01-28 NOTE — Patient Instructions (Signed)
Call if you have any changes in your bladder control or emptying!

## 2017-02-03 ENCOUNTER — Encounter: Payer: Self-pay | Admitting: Cardiovascular Disease

## 2017-02-08 LAB — LIPID PANEL
CHOL/HDL RATIO: 3 ratio (ref 0.0–4.4)
Cholesterol, Total: 174 mg/dL (ref 100–199)
HDL: 58 mg/dL (ref >39)
LDL Calculated: 89 mg/dL (ref 0–99)
Triglycerides: 136 mg/dL (ref 0–149)
VLDL Cholesterol Cal: 27 mg/dL (ref 5–40)

## 2017-02-08 LAB — SPECIMEN STATUS REPORT

## 2017-02-16 DIAGNOSIS — N3946 Mixed incontinence: Secondary | ICD-10-CM | POA: Diagnosis not present

## 2017-02-16 DIAGNOSIS — N3944 Nocturnal enuresis: Secondary | ICD-10-CM | POA: Diagnosis not present

## 2017-02-16 DIAGNOSIS — M62838 Other muscle spasm: Secondary | ICD-10-CM | POA: Diagnosis not present

## 2017-02-16 DIAGNOSIS — M6281 Muscle weakness (generalized): Secondary | ICD-10-CM | POA: Diagnosis not present

## 2017-03-09 DIAGNOSIS — M25571 Pain in right ankle and joints of right foot: Secondary | ICD-10-CM | POA: Diagnosis not present

## 2017-03-11 ENCOUNTER — Other Ambulatory Visit: Payer: Self-pay | Admitting: Cardiovascular Disease

## 2017-03-11 MED ORDER — ATORVASTATIN CALCIUM 80 MG PO TABS: 80.0000 mg | ORAL_TABLET | Freq: Every day | ORAL | 3 refills | Status: DC

## 2017-03-16 DIAGNOSIS — S93411A Sprain of calcaneofibular ligament of right ankle, initial encounter: Secondary | ICD-10-CM | POA: Diagnosis not present

## 2017-03-18 DIAGNOSIS — S93411A Sprain of calcaneofibular ligament of right ankle, initial encounter: Secondary | ICD-10-CM | POA: Diagnosis not present

## 2017-03-22 DIAGNOSIS — S93411A Sprain of calcaneofibular ligament of right ankle, initial encounter: Secondary | ICD-10-CM | POA: Diagnosis not present

## 2017-03-25 DIAGNOSIS — S93411A Sprain of calcaneofibular ligament of right ankle, initial encounter: Secondary | ICD-10-CM | POA: Diagnosis not present

## 2017-03-29 DIAGNOSIS — S93411A Sprain of calcaneofibular ligament of right ankle, initial encounter: Secondary | ICD-10-CM | POA: Diagnosis not present

## 2017-04-01 DIAGNOSIS — S93411A Sprain of calcaneofibular ligament of right ankle, initial encounter: Secondary | ICD-10-CM | POA: Diagnosis not present

## 2017-04-05 DIAGNOSIS — S93411A Sprain of calcaneofibular ligament of right ankle, initial encounter: Secondary | ICD-10-CM | POA: Diagnosis not present

## 2017-04-07 DIAGNOSIS — S93411A Sprain of calcaneofibular ligament of right ankle, initial encounter: Secondary | ICD-10-CM | POA: Diagnosis not present

## 2017-04-11 DIAGNOSIS — S93411A Sprain of calcaneofibular ligament of right ankle, initial encounter: Secondary | ICD-10-CM | POA: Diagnosis not present

## 2017-04-15 DIAGNOSIS — S93411A Sprain of calcaneofibular ligament of right ankle, initial encounter: Secondary | ICD-10-CM | POA: Diagnosis not present

## 2017-04-20 DIAGNOSIS — M25571 Pain in right ankle and joints of right foot: Secondary | ICD-10-CM | POA: Diagnosis not present

## 2017-04-22 DIAGNOSIS — S93411A Sprain of calcaneofibular ligament of right ankle, initial encounter: Secondary | ICD-10-CM | POA: Diagnosis not present

## 2017-04-27 DIAGNOSIS — S93411A Sprain of calcaneofibular ligament of right ankle, initial encounter: Secondary | ICD-10-CM | POA: Diagnosis not present

## 2017-05-02 DIAGNOSIS — M25571 Pain in right ankle and joints of right foot: Secondary | ICD-10-CM | POA: Diagnosis not present

## 2017-05-09 DIAGNOSIS — M25571 Pain in right ankle and joints of right foot: Secondary | ICD-10-CM | POA: Diagnosis not present

## 2017-05-25 DIAGNOSIS — G8929 Other chronic pain: Secondary | ICD-10-CM | POA: Diagnosis not present

## 2017-05-25 DIAGNOSIS — M25571 Pain in right ankle and joints of right foot: Secondary | ICD-10-CM | POA: Diagnosis not present

## 2017-05-25 DIAGNOSIS — M25471 Effusion, right ankle: Secondary | ICD-10-CM | POA: Diagnosis not present

## 2017-07-24 DIAGNOSIS — K645 Perianal venous thrombosis: Secondary | ICD-10-CM | POA: Diagnosis not present

## 2017-09-07 ENCOUNTER — Telehealth: Payer: Self-pay

## 2017-09-07 NOTE — Telephone Encounter (Signed)
Copied from CRM [phone]. Topic: Appointment Scheduling - Prior Auth Required for Appointment >> Sep 07, 2017  4:07 PM Viviann SpareWhite, Selina wrote: No appointment has been scheduled. Patient is requesting a new appointment. Per scheduling protocol, this appointment requires a prior authorization prior to scheduling. Patient stated that her cousin Earleen ReaperJennifer Middleton is a patient of Dr. Abner GreenspanBlyth.  Route to department's PEC pool.

## 2017-09-07 NOTE — Telephone Encounter (Signed)
I am willing to see her as a patient. Please arrange.

## 2017-09-07 NOTE — Telephone Encounter (Signed)
Please advise 

## 2017-09-07 NOTE — Telephone Encounter (Signed)
Please call Pt- okay to schedule NP w/ Dr. Abner GreenspanBlyth. Thank you.

## 2017-09-08 NOTE — Telephone Encounter (Signed)
NP appt has been made for 10/14/17 at 11:00am

## 2017-09-28 DIAGNOSIS — D1801 Hemangioma of skin and subcutaneous tissue: Secondary | ICD-10-CM | POA: Diagnosis not present

## 2017-09-28 DIAGNOSIS — L814 Other melanin hyperpigmentation: Secondary | ICD-10-CM | POA: Diagnosis not present

## 2017-09-28 DIAGNOSIS — D2262 Melanocytic nevi of left upper limb, including shoulder: Secondary | ICD-10-CM | POA: Diagnosis not present

## 2017-09-28 DIAGNOSIS — L821 Other seborrheic keratosis: Secondary | ICD-10-CM | POA: Diagnosis not present

## 2017-10-03 ENCOUNTER — Encounter: Payer: Self-pay | Admitting: Family Medicine

## 2017-10-03 DIAGNOSIS — Z01419 Encounter for gynecological examination (general) (routine) without abnormal findings: Secondary | ICD-10-CM | POA: Diagnosis not present

## 2017-10-03 DIAGNOSIS — Z1231 Encounter for screening mammogram for malignant neoplasm of breast: Secondary | ICD-10-CM | POA: Diagnosis not present

## 2017-10-03 DIAGNOSIS — Z008 Encounter for other general examination: Secondary | ICD-10-CM | POA: Diagnosis not present

## 2017-10-03 DIAGNOSIS — R5383 Other fatigue: Secondary | ICD-10-CM | POA: Diagnosis not present

## 2017-10-03 DIAGNOSIS — Z6836 Body mass index (BMI) 36.0-36.9, adult: Secondary | ICD-10-CM | POA: Diagnosis not present

## 2017-10-04 LAB — HM MAMMOGRAPHY

## 2017-10-14 ENCOUNTER — Ambulatory Visit: Payer: BLUE CROSS/BLUE SHIELD | Admitting: Family Medicine

## 2017-10-14 ENCOUNTER — Encounter: Payer: Self-pay | Admitting: Family Medicine

## 2017-10-14 VITALS — BP 118/80 | HR 56 | Temp 98.4°F | Resp 18 | Ht 66.0 in | Wt 222.0 lb

## 2017-10-14 DIAGNOSIS — I341 Nonrheumatic mitral (valve) prolapse: Secondary | ICD-10-CM

## 2017-10-14 DIAGNOSIS — Z0000 Encounter for general adult medical examination without abnormal findings: Secondary | ICD-10-CM

## 2017-10-14 DIAGNOSIS — Z8619 Personal history of other infectious and parasitic diseases: Secondary | ICD-10-CM | POA: Diagnosis not present

## 2017-10-14 DIAGNOSIS — K649 Unspecified hemorrhoids: Secondary | ICD-10-CM | POA: Diagnosis not present

## 2017-10-14 DIAGNOSIS — R7989 Other specified abnormal findings of blood chemistry: Secondary | ICD-10-CM

## 2017-10-14 DIAGNOSIS — E669 Obesity, unspecified: Secondary | ICD-10-CM

## 2017-10-14 DIAGNOSIS — D649 Anemia, unspecified: Secondary | ICD-10-CM | POA: Diagnosis not present

## 2017-10-14 LAB — COMPREHENSIVE METABOLIC PANEL
ALBUMIN: 4.2 g/dL (ref 3.5–5.2)
ALK PHOS: 44 U/L (ref 39–117)
ALT: 36 U/L — ABNORMAL HIGH (ref 0–35)
AST: 27 U/L (ref 0–37)
BUN: 18 mg/dL (ref 6–23)
CALCIUM: 10.2 mg/dL (ref 8.4–10.5)
CO2: 32 mEq/L (ref 19–32)
Chloride: 105 mEq/L (ref 96–112)
Creatinine, Ser: 0.91 mg/dL (ref 0.40–1.20)
GFR: 68.68 mL/min (ref >60.00)
Glucose, Bld: 96 mg/dL (ref 70–99)
POTASSIUM: 4.8 meq/L (ref 3.5–5.1)
Sodium: 144 mEq/L (ref 135–145)
TOTAL PROTEIN: 7.2 g/dL (ref 6.0–8.3)
Total Bilirubin: 1.1 mg/dL (ref 0.2–1.2)

## 2017-10-14 LAB — T4, FREE: FREE T4: 0.69 ng/dL (ref 0.60–1.60)

## 2017-10-14 LAB — TSH: TSH: 1.71 u[IU]/mL (ref 0.35–4.50)

## 2017-10-14 LAB — T3, FREE: T3 FREE: 3.8 pg/mL (ref 2.3–4.2)

## 2017-10-14 NOTE — Progress Notes (Signed)
Subjective:  I acted as a Neurosurgeon for Dr. Abner Greenspan. [place], [place]  Patient ID: Jessica Bradford, female    DOB: 15-May-1966, 52 y.o.   MRN: [id number]  Chief Complaint  Patient presents with  . Establish Care    HPI  Patient is in today to establish care and she has chronic medical concerns including hyperlipidemia, MVP, obesity, hemorrhoids and anemia. She feels well today. No recent febrile illness or hospitalizations. She mostly notes fatigue and acknowledges she is very busy and under stress but is concerned it is something more than that. Exercises regularly at the gym but acknowledges stress eating. Denies CP/palp/SOB/HA/congestion/fevers/GI or GU c/o. Taking meds as prescribed  Patient Care Team: Bradd Canary, MD as PCP - General (Family Medicine) Nahser, Deloris Ping, MD as Consulting Physician (Cardiology) Annamaria Helling, MD as Consulting Physician (Obstetrics and Gynecology) Aris Lot, MD as Consulting Physician (Dermatology)   Past Medical History:  Diagnosis Date  . Anemia   . Anxiety   . Heart palpitations   . History of chicken pox   . Hyperlipidemia   . Incomplete right bundle branch block   . Mitral valve prolapse   . Urinary incontinence     Past Surgical History:  Procedure Laterality Date  . CHOLECYSTECTOMY    . TUBAL LIGATION  1996  . TUBAL LIGATION      Family History  Problem Relation Age of Onset  . Stroke Father   . Hypertension Father   . Alcohol abuse Father   . Heart attack Mother   . Alcohol abuse Sister   . Alcohol abuse Brother   . Drug abuse Brother        Heroin  . Heart attack Brother   . Alcohol abuse Brother   . Drug abuse Brother        THC  . Heart disease Maternal Grandmother   . Heart disease Maternal Grandfather   . Hypertension Paternal Grandmother   . Dementia Paternal Grandmother   . Cancer Paternal Grandfather 74       lung, smoker  . Other Paternal Grandfather        noncancerous tumor    Social History    Socioeconomic History  . Marital status: Married    Spouse name: Not on file  . Number of children: Not on file  . Years of education: Not on file  . Highest education level: Not on file  Social Needs  . Financial resource strain: Not on file  . Food insecurity - worry: Not on file  . Food insecurity - inability: Not on file  . Transportation needs - medical: Not on file  . Transportation needs - non-medical: Not on file  Occupational History  . Not on file  Tobacco Use  . Smoking status: Never Smoker  . Smokeless tobacco: Never Used  Substance and Sexual Activity  . Alcohol use: Yes    Alcohol/week: 1.2 oz    Types: 2 Cans of beer per week  . Drug use: No  . Sexual activity: Yes    Birth control/protection: Surgical    Comment: Tubal  Other Topics Concern  . Not on file  Social History Narrative   Works for [employer], no major dietary restrictions, lives with husband and their dog    Outpatient Medications Prior to Visit  Medication Sig Dispense Refill  . ALPRAZolam (XANAX) 0.25 MG tablet Take 0.5 tablets by mouth daily as needed.  1  . aspirin 81 MG tablet Take 81 mg  by mouth daily.      Marland Kitchen. atorvastatin (LIPITOR) 80 MG tablet Take 1 tablet (80 mg total) by mouth daily at 6 PM. 90 tablet 3  . LOW-OGESTREL 0.3-30 MG-MCG tablet Take 1 tablet by mouth daily.     . multivitamin (THERAGRAN) per tablet Take 1 tablet by mouth daily.      Marland Kitchen. oxybutynin (DITROPAN XL) 15 MG 24 hr tablet Take 1 tablet by mouth daily.  3  . sertraline (ZOLOFT) 50 MG tablet Take 1 tablet by mouth daily.  2   No facility-administered medications prior to visit.     Allergies  Allergen Reactions  . Sulfa Antibiotics Rash  . Doxycycline Rash  . Sulfur Rash    Review of Systems  Constitutional: Positive for malaise/fatigue. Negative for chills and fever.  HENT: Negative for congestion and hearing loss.   Eyes: Negative for discharge.  Respiratory: Negative for cough, sputum production and shortness  of breath.   Cardiovascular: Negative for chest pain, palpitations and leg swelling.  Gastrointestinal: Negative for abdominal pain, blood in stool, constipation, diarrhea, heartburn, nausea and vomiting.  Genitourinary: Negative for dysuria, frequency, hematuria and urgency.  Musculoskeletal: Negative for back pain, falls and myalgias.  Skin: Negative for rash.  Neurological: Negative for dizziness, sensory change, loss of consciousness, weakness and headaches.  Endo/Heme/Allergies: Negative for environmental allergies. Does not bruise/bleed easily.  Psychiatric/Behavioral: Negative for depression and suicidal ideas. The patient is not nervous/anxious and does not have insomnia.        Objective:    Physical Exam  Constitutional: She is oriented to person, place, and time. She appears well-developed and well-nourished. No distress.  HENT:  Head: Normocephalic and atraumatic.  Eyes: Conjunctivae are normal.  Neck: Neck supple. No thyromegaly present.  Cardiovascular: Normal rate, regular rhythm and normal heart sounds.  No murmur heard. Pulmonary/Chest: Effort normal and breath sounds normal. No respiratory distress.  Abdominal: Soft. Bowel sounds are normal. She exhibits no distension and no mass. There is no tenderness.  Musculoskeletal: She exhibits no edema.  Lymphadenopathy:    She has no cervical adenopathy.  Neurological: She is alert and oriented to person, place, and time.  Skin: Skin is warm and dry.  Psychiatric: She has a normal mood and affect. Her behavior is normal.    BP 118/80 (BP Location: Left Arm, Patient Position: Sitting, Cuff Size: Normal)   Pulse (!) 56   Temp 98.4 F (36.9 C) (Oral)   Resp 18   Ht 5\' 6"  (1.676 m)   Wt 222 lb (100.7 kg)   LMP 09/13/2016   SpO2 97%   BMI 35.83 kg/m  Wt Readings from Last 3 Encounters:  10/14/17 222 lb (100.7 kg)  01/28/17 207 lb (93.9 kg)  01/26/17 206 lb 8 oz (93.7 kg)   BP Readings from Last 3 Encounters:   10/14/17 118/80  01/28/17 110/60  01/26/17 122/78     Immunization History  Administered Date(s) Administered  . Influenza-Unspecified 06/10/2014, 05/24/2015  . Tdap 05/23/2012, 02/11/2015    Health Maintenance  Topic Date Due  . Hepatitis C Screening  01-Jul-1966  . HIV Screening  08/03/1979  . PAP SMEAR  08/02/1985  . MAMMOGRAM  08/02/2014  . COLONOSCOPY  08/02/2014  . TETANUS/TDAP  02/10/2025  . INFLUENZA VACCINE  Completed    Lab Results  Component Value Date   GLUCOSE 96 10/14/2017   CHOL 174 01/19/2017   TRIG 136 01/19/2017   HDL 58 01/19/2017   LDLCALC 89 01/19/2017  ALT 36 (H) 10/14/2017   AST 27 10/14/2017   NA 144 10/14/2017   K 4.8 10/14/2017   CL 105 10/14/2017   CREATININE 0.91 10/14/2017   BUN 18 10/14/2017   CO2 32 10/14/2017   TSH 1.71 10/14/2017    Lab Results  Component Value Date   TSH 1.71 10/14/2017   No results found for: WBC, HGB, HCT, MCV, PLT Lab Results  Component Value Date   NA 144 10/14/2017   K 4.8 10/14/2017   CO2 32 10/14/2017   GLUCOSE 96 10/14/2017   BUN 18 10/14/2017   CREATININE 0.91 10/14/2017   BILITOT 1.1 10/14/2017   ALKPHOS 44 10/14/2017   AST 27 10/14/2017   ALT 36 (H) 10/14/2017   PROT 7.2 10/14/2017   ALBUMIN 4.2 10/14/2017   CALCIUM 10.2 10/14/2017   GFR 68.68 10/14/2017   Lab Results  Component Value Date   CHOL 174 01/19/2017   Lab Results  Component Value Date   HDL 58 01/19/2017   Lab Results  Component Value Date   LDLCALC 89 01/19/2017   Lab Results  Component Value Date   TRIG 136 01/19/2017   Lab Results  Component Value Date   CHOLHDL 3.0 01/19/2017   No results found for: HGBA1C       Assessment & Plan:   Problem List Items Addressed This Visit    MVP (mitral valve prolapse)    Asymptomatic. She notes a family history of congenital heart disease but does not remember the name of the disease      Elevated serum creatinine - Primary    Noted on lab work she brought to  visit. On repeat resolved. Encouraged adequate hydration      Relevant Orders   Comprehensive metabolic panel (Completed)   History of chicken pox   Obesity    Encouraged DASH diet, decrease po intake and increase exercise as tolerated. Needs 7-8 hours of sleep nightly. Avoid trans fats, eat small, frequent meals every 4-5 hours with lean proteins, complex carbs and healthy fats. Minimize simple carbs, bariatric referral offered      Anemia    Increase leafy greens, consider increased lean red meat and using cast iron cookware. Continue to monitor, report any concerns      Preventative health care    Patient encouraged to maintain heart healthy diet, regular exercise, adequate sleep. Consider daily probiotics. Take medications as prescribed      Relevant Orders   Comprehensive metabolic panel (Completed)   TSH (Completed)    Other Visit Diagnoses    Elevated TSH       Relevant Orders   T4, free (Completed)   T3, free (Completed)   TSH (Completed)      I am having Amilia Deleo maintain her multivitamin, aspirin, LOW-OGESTREL, sertraline, oxybutynin, ALPRAZolam, and atorvastatin.  No orders of the defined types were placed in this encounter.   CMA served as Neurosurgeon during this visit. History, Physical and Plan performed by medical provider. Documentation and orders reviewed and attested to.  Danise Edge, MD

## 2017-10-14 NOTE — Patient Instructions (Addendum)
Shingrix is the new shingles shot, 2 shots over 2-6 months check with insurance payment DASH or MIND diet Preventive Care 40-64 Years, Female Preventive care refers to lifestyle choices and visits with your health care provider that can promote health and wellness. What does preventive care include?  A yearly physical exam. This is also called an annual well check.  Dental exams once or twice a year.  Routine eye exams. Ask your health care provider how often you should have your eyes checked.  Personal lifestyle choices, including: ? Daily care of your teeth and gums. ? Regular physical activity. ? Eating a healthy diet. ? Avoiding tobacco and drug use. ? Limiting alcohol use. ? Practicing safe sex. ? Taking low-dose aspirin daily starting at age 23. ? Taking vitamin and mineral supplements as recommended by your health care provider. What happens during an annual well check? The services and screenings done by your health care provider during your annual well check will depend on your age, overall health, lifestyle risk factors, and family history of disease. Counseling Your health care provider may ask you questions about your:  Alcohol use.  Tobacco use.  Drug use.  Emotional well-being.  Home and relationship well-being.  Sexual activity.  Eating habits.  Work and work Statistician.  Method of birth control.  Menstrual cycle.  Pregnancy history.  Screening You may have the following tests or measurements:  Height, weight, and BMI.  Blood pressure.  Lipid and cholesterol levels. These may be checked every 5 years, or more frequently if you are over 41 years old.  Skin check.  Lung cancer screening. You may have this screening every year starting at age 60 if you have a 30-pack-year history of smoking and currently smoke or have quit within the past 15 years.  Fecal occult blood test (FOBT) of the stool. You may have this test every year starting at age  5.  Flexible sigmoidoscopy or colonoscopy. You may have a sigmoidoscopy every 5 years or a colonoscopy every 10 years starting at age 71.  Hepatitis C blood test.  Hepatitis B blood test.  Sexually transmitted disease (STD) testing.  Diabetes screening. This is done by checking your blood sugar (glucose) after you have not eaten for a while (fasting). You may have this done every 1-3 years.  Mammogram. This may be done every 1-2 years. Talk to your health care provider about when you should start having regular mammograms. This may depend on whether you have a family history of breast cancer.  BRCA-related cancer screening. This may be done if you have a family history of breast, ovarian, tubal, or peritoneal cancers.  Pelvic exam and Pap test. This may be done every 3 years starting at age 44. Starting at age 50, this may be done every 5 years if you have a Pap test in combination with an HPV test.  Bone density scan. This is done to screen for osteoporosis. You may have this scan if you are at high risk for osteoporosis.  Discuss your test results, treatment options, and if necessary, the need for more tests with your health care provider. Vaccines Your health care provider may recommend certain vaccines, such as:  Influenza vaccine. This is recommended every year.  Tetanus, diphtheria, and acellular pertussis (Tdap, Td) vaccine. You may need a Td booster every 10 years.  Varicella vaccine. You may need this if you have not been vaccinated.  Zoster vaccine. You may need this after age 76.  Measles,  mumps, and rubella (MMR) vaccine. You may need at least one dose of MMR if you were born in 1957 or later. You may also need a second dose.  Pneumococcal 13-valent conjugate (PCV13) vaccine. You may need this if you have certain conditions and were not previously vaccinated.  Pneumococcal polysaccharide (PPSV23) vaccine. You may need one or two doses if you smoke cigarettes or if you  have certain conditions.  Meningococcal vaccine. You may need this if you have certain conditions.  Hepatitis A vaccine. You may need this if you have certain conditions or if you travel or work in places where you may be exposed to hepatitis A.  Hepatitis B vaccine. You may need this if you have certain conditions or if you travel or work in places where you may be exposed to hepatitis B.  Haemophilus influenzae type b (Hib) vaccine. You may need this if you have certain conditions.  Talk to your health care provider about which screenings and vaccines you need and how often you need them. This information is not intended to replace advice given to you by your health care provider. Make sure you discuss any questions you have with your health care provider. Document Released: 09/05/2015 Document Revised: 04/28/2016 Document Reviewed: 06/10/2015 Elsevier Interactive Patient Education  [employer].

## 2017-10-15 DIAGNOSIS — Z0000 Encounter for general adult medical examination without abnormal findings: Secondary | ICD-10-CM | POA: Insufficient documentation

## 2017-10-15 DIAGNOSIS — K649 Unspecified hemorrhoids: Secondary | ICD-10-CM | POA: Insufficient documentation

## 2017-10-15 DIAGNOSIS — D649 Anemia, unspecified: Secondary | ICD-10-CM | POA: Insufficient documentation

## 2017-10-15 NOTE — Assessment & Plan Note (Signed)
Encouraged DASH diet, decrease po intake and increase exercise as tolerated. Needs 7-8 hours of sleep nightly. Avoid trans fats, eat small, frequent meals every 4-5 hours with lean proteins, complex carbs and healthy fats. Minimize simple carbs, bariatric referral offered 

## 2017-10-15 NOTE — Assessment & Plan Note (Signed)
Encouraged adequate hydration, fiber and exercise, cleanse with witch hazel astringent and use Anusol HC suppositories qhs prn, if increasingly symptomatic let us know and we will refer for evaluation with GI. Has had colonoscopy in past

## 2017-10-15 NOTE — Assessment & Plan Note (Signed)
Increase leafy greens, consider increased lean red meat and using cast iron cookware. Continue to monitor, report any concerns 

## 2017-10-15 NOTE — Assessment & Plan Note (Addendum)
Patient encouraged to maintain heart healthy diet, regular exercise, adequate sleep. Consider daily probiotics. Take medications as prescribed. Have requested old records and ordered labs.

## 2017-10-15 NOTE — Assessment & Plan Note (Signed)
Noted on lab work she brought to visit. On repeat resolved. Encouraged adequate hydration

## 2017-10-15 NOTE — Assessment & Plan Note (Signed)
Asymptomatic. She notes a family history of congenital heart disease but does not remember the name of the disease

## 2017-11-01 ENCOUNTER — Encounter: Payer: Self-pay | Admitting: Family Medicine

## 2017-11-01 DIAGNOSIS — N951 Menopausal and female climacteric states: Secondary | ICD-10-CM | POA: Diagnosis not present

## 2017-11-29 DIAGNOSIS — J40 Bronchitis, not specified as acute or chronic: Secondary | ICD-10-CM | POA: Diagnosis not present

## 2017-11-29 DIAGNOSIS — R0789 Other chest pain: Secondary | ICD-10-CM | POA: Diagnosis not present

## 2017-12-01 ENCOUNTER — Telehealth: Payer: Self-pay | Admitting: Cardiovascular Disease

## 2017-12-01 DIAGNOSIS — E782 Mixed hyperlipidemia: Secondary | ICD-10-CM

## 2017-12-01 DIAGNOSIS — I341 Nonrheumatic mitral (valve) prolapse: Secondary | ICD-10-CM

## 2017-12-01 NOTE — Telephone Encounter (Signed)
New message    Patient calling she is scheduled 02/21/18 for yearly wants to have her lab work done before hand , could you please put in order , thank you

## 2017-12-07 NOTE — Telephone Encounter (Signed)
Left detailed message for patient that I am scheduling a lab appointment on 6/27 for her fasting lab work and to call back with questions or to reschedule her lab appointment.

## 2017-12-14 DIAGNOSIS — R42 Dizziness and giddiness: Secondary | ICD-10-CM | POA: Diagnosis not present

## 2018-01-05 DIAGNOSIS — Z0000 Encounter for general adult medical examination without abnormal findings: Secondary | ICD-10-CM | POA: Diagnosis not present

## 2018-01-10 ENCOUNTER — Encounter: Payer: Self-pay | Admitting: Family Medicine

## 2018-01-30 ENCOUNTER — Other Ambulatory Visit: Payer: Self-pay | Admitting: Cardiovascular Disease

## 2018-02-16 ENCOUNTER — Other Ambulatory Visit: Payer: BLUE CROSS/BLUE SHIELD | Admitting: *Deleted

## 2018-02-16 DIAGNOSIS — E782 Mixed hyperlipidemia: Secondary | ICD-10-CM | POA: Diagnosis not present

## 2018-02-16 DIAGNOSIS — I341 Nonrheumatic mitral (valve) prolapse: Secondary | ICD-10-CM | POA: Diagnosis not present

## 2018-02-16 LAB — BASIC METABOLIC PANEL
BUN / CREAT RATIO: 19 (ref 9–23)
BUN: 18 mg/dL (ref 6–24)
CO2: 26 mmol/L (ref 20–29)
Calcium: 9.5 mg/dL (ref 8.7–10.2)
Chloride: 101 mmol/L (ref 96–106)
Creatinine, Ser: 0.94 mg/dL (ref 0.57–1.00)
GFR calc Af Amer: 80 mL/min/{1.73_m2} (ref >59)
GFR, EST NON AFRICAN AMERICAN: 69 mL/min/{1.73_m2} (ref >59)
GLUCOSE: 95 mg/dL (ref 65–99)
POTASSIUM: 4.9 mmol/L (ref 3.5–5.2)
SODIUM: 139 mmol/L (ref 134–144)

## 2018-02-16 LAB — LIPID PANEL
CHOLESTEROL TOTAL: 146 mg/dL (ref 100–199)
Chol/HDL Ratio: 2.6 ratio (ref 0.0–4.4)
HDL: 57 mg/dL (ref >39)
LDL CALC: 74 mg/dL (ref 0–99)
Triglycerides: 77 mg/dL (ref 0–149)
VLDL CHOLESTEROL CAL: 15 mg/dL (ref 5–40)

## 2018-02-16 LAB — HEPATIC FUNCTION PANEL
ALBUMIN: 4.1 g/dL (ref 3.5–5.5)
ALT: 27 IU/L (ref 0–32)
AST: 27 IU/L (ref 0–40)
Alkaline Phosphatase: 70 IU/L (ref 39–117)
Bilirubin Total: 0.6 mg/dL (ref 0.0–1.2)
Bilirubin, Direct: 0.17 mg/dL (ref 0.00–0.40)
Total Protein: 6.3 g/dL (ref 6.0–8.5)

## 2018-02-21 ENCOUNTER — Encounter: Payer: Self-pay | Admitting: Cardiovascular Disease

## 2018-02-21 ENCOUNTER — Encounter (INDEPENDENT_AMBULATORY_CARE_PROVIDER_SITE_OTHER): Payer: Self-pay

## 2018-02-21 ENCOUNTER — Ambulatory Visit: Payer: BLUE CROSS/BLUE SHIELD | Admitting: Cardiovascular Disease

## 2018-02-21 VITALS — BP 98/80 | HR 77 | Ht 66.0 in | Wt 212.0 lb

## 2018-02-21 DIAGNOSIS — E782 Mixed hyperlipidemia: Secondary | ICD-10-CM | POA: Diagnosis not present

## 2018-02-21 MED ORDER — ATORVASTATIN CALCIUM 80 MG PO TABS: 80.0000 mg | ORAL_TABLET | Freq: Every day | ORAL | 3 refills | Status: DC

## 2018-02-21 NOTE — Patient Instructions (Signed)
Medication Instructions:  Your physician recommends that you continue on your current medications as directed. Please refer to the Current Medication list given to you today.   Labwork: Your physician recommends that you return for lab work in: 1 year on the day of or a few days before your office visit with Dr. Nahser.  You will need to FAST for this appointment - nothing to eat or drink after midnight the night before except water.   Testing/Procedures: None Ordered   Follow-Up: Your physician wants you to follow-up in: 1 year with Dr. Nahser.  You will receive a reminder letter in the mail two months in advance. If you don't receive a letter, please call our office to schedule the follow-up appointment.   If you need a refill on your cardiac medications before your next appointment, please call your pharmacy.   Thank you for choosing [facility]! Newt Levingston, RN [phone]    

## 2018-02-21 NOTE — Progress Notes (Signed)
Cardiology Office Note   Date:  02/21/2018   ID:  Jessica Bradford, DOB 05/13/1966, MRN [id number]  PCP:  Bradd Canary, MD  Cardiologist:   Kristeen Miss, MD   1. Hyperliidemia 2.     Jessica Bradford is a 52 year old female with a history of hypercholesterolemia. She has a very strong family history of coronary artery disease. She's done very well since she last saw Dr. Deborah Chalk last year. She walks 4-6 miles every day. She denies any chest pain or shortness of breath with her walking.  She has a history of mitral valve prolapse. She has occasional palpitations. These typically occur when she is at rest.  She is recovering from an episode of cellulitis. She apparently stepped on a splinter on her deck and developed cellulitis in her foot. She was on antibiotics for several weeks. She is still not exercising because of some persistent foot pain and swelling.  Nov. 19, 2014:  No CP, no dyspnea, Still walking 5+ miles a day. Also doing strength training. She gets a discount from her insurance plan to work out    Dec 23, 2014:  Jessica Bradford is a 52 y.o. female who presents for  Follow up of her hyperlipidemia Walks regularly,  Also does strength training.  Trying to watch her diet.   Jan 07, 2016: Doing well  Has had several episodes of waking up "thinking she was not breathing " Snores some but does not have apneic episodes according to husband  Exercising some .   [place] twice a week , walks the other days  Has had difficulty losing weight .   January 26, 2017:  Jessica Bradford is doing well from a cardiac standpoint Is very fatigued for the past 6 months . Is tired all of the time  Takes naps frequently  Still working out at [employer] - works outs are going ok .   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    Is losing some hair ( which is some concern)    Past Medical History:    Diagnosis Date  . Anemia   . Anxiety   . Heart palpitations   . History of chicken pox   . Hyperlipidemia   . Incomplete right bundle branch block   . Mitral valve prolapse   . Urinary incontinence     Past Surgical History:  Procedure Laterality Date  . CHOLECYSTECTOMY    . TUBAL LIGATION  1996  . TUBAL LIGATION       Current Outpatient Medications  Medication Sig Dispense Refill  . ALPRAZolam (XANAX) 0.25 MG tablet Take 0.5 tablets by mouth daily as needed.  1  . aspirin 81 MG tablet Take 81 mg by mouth daily.      Marland Kitchen atorvastatin (LIPITOR) 80 MG tablet Take 1 tablet (80 mg total) by mouth daily. 90 tablet 3  . multivitamin (THERAGRAN) per tablet Take 1 tablet by mouth daily.      Marland Kitchen oxybutynin (DITROPAN XL) 15 MG 24 hr tablet Take 1 tablet by mouth daily.  3  . sertraline (ZOLOFT) 50 MG tablet Take 1 tablet by mouth daily.  2   No current facility-administered medications for this visit.     Allergies:   Sulfa antibiotics; Doxycycline; and Sulfur    Social History:  The patient  reports that she has never smoked. She has  never used smokeless tobacco. She reports that she drinks about 1.2 oz of alcohol per week. She reports that she does not use drugs.   Family History:  The patient's family history includes Alcohol abuse in her brother, brother, father, and sister; Cancer (age of onset: 2466) in her paternal grandfather; Dementia in her paternal grandmother; Drug abuse in her brother and brother; Heart attack in her brother and mother; Heart disease in her maternal grandfather and maternal grandmother; Hypertension in her father and paternal grandmother; Other in her paternal grandfather; Stroke in her father.    ROS:  Please see the history of present illness.    Physical Exam: Blood pressure 98/80, pulse 77, height 5\' 6"  (1.676 m), weight 212 lb (96.2 kg), SpO2 (!) 77 %.  GEN:   Middle age female,   Mildly obese  HEENT: Normal NECK: No JVD; No carotid  bruits LYMPHATICS: No lymphadenopathy CARDIAC: RR,  No significant murmur  RESPIRATORY:  Clear to auscultation without rales, wheezing or rhonchi  ABDOMEN: Soft, non-tender, non-distended MUSCULOSKELETAL:  No edema; No deformity  SKIN: Warm and dry NEUROLOGIC:  Alert and oriented x 3   EKG:   February 21, 2018: Normal sinus rhythm at 77.  Incomplete right bundle branch block.  Left anterior fascicular block.   Recent Labs: 10/14/2017: TSH 1.71 02/16/2018: ALT 27; BUN 18; Creatinine, Ser 0.94; Potassium 4.9; Sodium 139    Lipid Panel    Component Value Date/Time   CHOL 146 02/16/2018 0758   CHOL 136 07/11/2013 0925   TRIG 77 02/16/2018 0758   TRIG 96 07/11/2013 0925   HDL 57 02/16/2018 0758   HDL 57 07/11/2013 0925   CHOLHDL 2.6 02/16/2018 0758   CHOLHDL 2.5 12/23/2015 0751   VLDL 16 12/23/2015 0751   LDLCALC 74 02/16/2018 0758   LDLCALC 60 07/11/2013 0925      Wt Readings from Last 3 Encounters:  02/21/18 212 lb (96.2 kg)  10/14/17 222 lb (100.7 kg)  01/28/17 207 lb (93.9 kg)      Other studies Reviewed: Additional studies/ records that were reviewed today include: . Review of the above records demonstrates:   ASSESSMENT AND PLAN:  1. Hyperliidemia-  Continue atorvastatin 80 a day.  His labs look good.  I will see her again in 1 year.  We will recheck labs at that time.   2.  Fatigue :   Per primary MD    Current medicines are reviewed at length with the patient today.  The patient does not have concerns regarding medicines.  The following changes have been made:  no change  Labs/ tests ordered today include:   Orders Placed This Encounter  Procedures  . Lipid Profile  . Basic Metabolic Panel (BMET)  . Hepatic function panel  . EKG 12-Lead    Disposition:   FU with me in 1 year     Kristeen MissPhilip Yarima Penman, MD  02/21/2018 11:01 AM    [facility] [address], [place], [place]  [address] Phone: [phone]; Fax: [phone]

## 2018-03-30 DIAGNOSIS — J40 Bronchitis, not specified as acute or chronic: Secondary | ICD-10-CM | POA: Diagnosis not present

## 2018-04-13 ENCOUNTER — Ambulatory Visit: Payer: BLUE CROSS/BLUE SHIELD | Admitting: Family Medicine

## 2018-04-13 DIAGNOSIS — M25551 Pain in right hip: Secondary | ICD-10-CM | POA: Diagnosis not present

## 2018-04-13 DIAGNOSIS — E669 Obesity, unspecified: Secondary | ICD-10-CM | POA: Diagnosis not present

## 2018-04-13 DIAGNOSIS — E785 Hyperlipidemia, unspecified: Secondary | ICD-10-CM

## 2018-04-13 MED ORDER — TIZANIDINE HCL 4 MG PO TABS: 4.0000 mg | ORAL_TABLET | Freq: Four times a day (QID) | ORAL | 2 refills | Status: DC | PRN

## 2018-04-13 NOTE — Patient Instructions (Addendum)
Consider [facility] is one option  Recommend calcium intake of 1200 to 1500 mg daily, divided into roughly 3 doses. Best source is the diet and a single dairy serving is about 500 mg, a supplement of calcium citrate once or twice daily to balance diet is fine if not getting enough in diet. Also need Vitamin D 2000 IU caps, 1 cap daily if not already taking vitamin D. Also recommend weight baring exercise on hips and upper body to keep bones strong   Hip Pain The hip is the joint between the upper legs and the lower pelvis. The bones, cartilage, tendons, and muscles of your hip joint support your body and allow you to move around. Hip pain can range from a minor ache to severe pain in one or both of your hips. The pain may be felt on the inside of the hip joint near the groin, or the outside near the buttocks and upper thigh. You may also have swelling or stiffness. Follow these instructions at home: Managing pain, stiffness, and swelling  If directed, apply ice to the injured area. ? Put ice in a plastic bag. ? Place a towel between your skin and the bag. ? Leave the ice on for 20 minutes, 2-3 times a day  Sleep with a pillow between your legs on your most comfortable side.  Avoid any activities that cause pain. General instructions  Take over-the-counter and prescription medicines only as told by your health care provider.  Do any exercises as told by your health care provider.  Record the following: ? How often you have hip pain. ? The location of your pain. ? What the pain feels like. ? What makes the pain worse.  Keep all follow-up visits as told by your health care provider. This is important. Contact a health care provider if:  You cannot put weight on your leg.  Your pain or swelling continues or gets worse after one week.  It gets harder to walk.  You have a fever. Get help right away if:  You fall.  You have a sudden increase in pain  and swelling in your hip.  Your hip is red or swollen or very tender to touch. Summary  Hip pain can range from a minor ache to severe pain in one or both of your hips.  The pain may be felt on the inside of the hip joint near the groin, or the outside near the buttocks and upper thigh.  Avoid any activities that cause pain.  Record how often you have hip pain, the location of the pain, what makes it worse and what it feels like. This information is not intended to replace advice given to you by your health care provider. Make sure you discuss any questions you have with your health care provider. Document Released: 01/27/2010 Document Revised: 07/12/2016 Document Reviewed: 07/12/2016 Elsevier Interactive Patient Education  [employer]2018 Elsevier Inc.

## 2018-04-13 NOTE — Assessment & Plan Note (Signed)
Encouraged DASH diet, decrease po intake and increase exercise as tolerated. Needs 7-8 hours of sleep nightly. Avoid trans fats, eat small, frequent meals every 4-5 hours with lean proteins, complex carbs and healthy fats. Minimize simple carbs 

## 2018-04-13 NOTE — Assessment & Plan Note (Signed)
Encouraged moist heat and gentle stretching as tolerated. May try NSAIDs and prescription meds as directed and report if symptoms worsen or seek immediate care. Consider chiropractic and a muscle relaxer prn

## 2018-04-13 NOTE — Assessment & Plan Note (Signed)
Encouraged heart healthy diet, increase exercise, avoid trans fats, consider a krill oil cap daily 

## 2018-04-14 ENCOUNTER — Telehealth: Payer: Self-pay | Admitting: *Deleted

## 2018-04-14 NOTE — Telephone Encounter (Signed)
[facility]; forwarded to provider/SLS 08/23

## 2018-04-16 NOTE — Progress Notes (Signed)
Subjective:    Patient ID: Jessica Bradford, female    DOB: Nov 17, 1965, 52 y.o.   MRN: [id number]  No chief complaint on file.   HPI Patient is in today for follow up. She has been strugglign with posterior right hip pain and it is affecting her sleep. She denies any fall or injury. No radicular symptoms or incontinence. She has trouble getting comfortable at night as a result. Denies CP/palp/SOB/HA/congestion/fevers/GI or GU c/o. Taking meds as prescribed  Past Medical History:  Diagnosis Date  . Anemia   . Anxiety   . Heart palpitations   . History of chicken pox   . Hyperlipidemia   . Incomplete right bundle branch block   . Mitral valve prolapse   . Urinary incontinence     Past Surgical History:  Procedure Laterality Date  . CHOLECYSTECTOMY    . TUBAL LIGATION  1996  . TUBAL LIGATION      Family History  Problem Relation Age of Onset  . Stroke Father   . Hypertension Father   . Alcohol abuse Father   . Heart attack Mother   . Alcohol abuse Sister   . Alcohol abuse Brother   . Drug abuse Brother        Heroin  . Heart attack Brother   . Alcohol abuse Brother   . Drug abuse Brother        THC  . Heart disease Maternal Grandmother   . Heart disease Maternal Grandfather   . Hypertension Paternal Grandmother   . Dementia Paternal Grandmother   . Cancer Paternal Grandfather [id number]       lung, smoker  . Other Paternal Grandfather        noncancerous tumor    Social History   Socioeconomic History  . Marital status: Married    Spouse name: Not on file  . Number of children: Not on file  . Years of education: Not on file  . Highest education level: Not on file  Occupational History  . Not on file  Social Needs  . Financial resource strain: Not on file  . Food insecurity:    Worry: Not on file    Inability: Not on file  . Transportation needs:    Medical: Not on file    Non-medical: Not on file  Tobacco Use  . Smoking status: Never Smoker  . Smokeless  tobacco: Never Used  Substance and Sexual Activity  . Alcohol use: Yes    Alcohol/week: 2.0 standard drinks    Types: 2 Cans of beer per week  . Drug use: No  . Sexual activity: Yes    Birth control/protection: Surgical    Comment: Tubal  Lifestyle  . Physical activity:    Days per week: Not on file    Minutes per session: Not on file  . Stress: Not on file  Relationships  . Social connections:    Talks on phone: Not on file    Gets together: Not on file    Attends religious service: Not on file    Active member of club or organization: Not on file    Attends meetings of clubs or organizations: Not on file    Relationship status: Not on file  . Intimate partner violence:    Fear of current or ex partner: Not on file    Emotionally abused: Not on file    Physically abused: Not on file    Forced sexual activity: Not on file  Other Topics  Concern  . Not on file  Social History Narrative   Works for [employer], no major dietary restrictions, lives with husband and their dog    Outpatient Medications Prior to Visit  Medication Sig Dispense Refill  . ALPRAZolam (XANAX) 0.25 MG tablet Take 0.5 tablets by mouth daily as needed.  1  . aspirin 81 MG tablet Take 81 mg by mouth daily.      Marland Kitchen atorvastatin (LIPITOR) 80 MG tablet Take 1 tablet (80 mg total) by mouth daily. 90 tablet 3  . multivitamin (THERAGRAN) per tablet Take 1 tablet by mouth daily.      Marland Kitchen oxybutynin (DITROPAN XL) 15 MG 24 hr tablet Take 1 tablet by mouth daily.  3  . sertraline (ZOLOFT) 50 MG tablet Take 1 tablet by mouth daily.  2   No facility-administered medications prior to visit.     Allergies  Allergen Reactions  . Sulfa Antibiotics Rash  . Doxycycline Rash  . Sulfur Rash    Review of Systems  Constitutional: Negative for fever and malaise/fatigue.  HENT: Negative for congestion.   Eyes: Negative for blurred vision.  Respiratory: Negative for shortness of breath.   Cardiovascular: Negative for chest  pain, palpitations and leg swelling.  Gastrointestinal: Negative for abdominal pain, blood in stool, nausea and vomiting.  Genitourinary: Negative for dysuria and frequency.  Musculoskeletal: Positive for joint pain. Negative for falls.  Skin: Negative for rash.  Neurological: Negative for dizziness, loss of consciousness and headaches.  Endo/Heme/Allergies: Negative for environmental allergies.  Psychiatric/Behavioral: Negative for depression. The patient is not nervous/anxious.        Objective:    Physical Exam  Constitutional: She is oriented to person, place, and time. She appears well-developed and well-nourished. No distress.  HENT:  Head: Normocephalic and atraumatic.  Nose: Nose normal.  Eyes: Right eye exhibits no discharge. Left eye exhibits no discharge.  Neck: Normal range of motion. Neck supple.  Cardiovascular: Normal rate and regular rhythm.  No murmur heard. Pulmonary/Chest: Effort normal and breath sounds normal.  Abdominal: Soft. Bowel sounds are normal. There is no tenderness.  Musculoskeletal: She exhibits tenderness. She exhibits no edema.  Pain with palpation over posterior right hip  Neurological: She is alert and oriented to person, place, and time.  Skin: Skin is warm and dry.  Psychiatric: She has a normal mood and affect.  Nursing note and vitals reviewed.   BP 90/68 (BP Location: Left Arm, Patient Position: Sitting, Cuff Size: Normal)   Pulse 75   Temp 97.7 F (36.5 C) (Oral)   Resp 18   Ht 5\' 6"  (1.676 m)   Wt 209 lb 3.2 oz (94.9 kg)   SpO2 98%   BMI 33.77 kg/m  Wt Readings from Last 3 Encounters:  04/13/18 209 lb 3.2 oz (94.9 kg)  02/21/18 212 lb (96.2 kg)  10/14/17 222 lb (100.7 kg)     Lab Results  Component Value Date   GLUCOSE 95 02/16/2018   CHOL 146 02/16/2018   TRIG 77 02/16/2018   HDL 57 02/16/2018   LDLCALC 74 02/16/2018   ALT 27 02/16/2018   AST 27 02/16/2018   NA 139 02/16/2018   K 4.9 02/16/2018   CL 101 02/16/2018    CREATININE 0.94 02/16/2018   BUN 18 02/16/2018   CO2 26 02/16/2018   TSH 1.71 10/14/2017    Lab Results  Component Value Date   TSH 1.71 10/14/2017   No results found for: WBC, HGB, HCT, MCV, PLT  Lab Results  Component Value Date   NA 139 02/16/2018   K 4.9 02/16/2018   CO2 26 02/16/2018   GLUCOSE 95 02/16/2018   BUN 18 02/16/2018   CREATININE 0.94 02/16/2018   BILITOT 0.6 02/16/2018   ALKPHOS 70 02/16/2018   AST 27 02/16/2018   ALT 27 02/16/2018   PROT 6.3 02/16/2018   ALBUMIN 4.1 02/16/2018   CALCIUM 9.5 02/16/2018   GFR 68.68 10/14/2017   Lab Results  Component Value Date   CHOL 146 02/16/2018   Lab Results  Component Value Date   HDL 57 02/16/2018   Lab Results  Component Value Date   LDLCALC 74 02/16/2018   Lab Results  Component Value Date   TRIG 77 02/16/2018   Lab Results  Component Value Date   CHOLHDL 2.6 02/16/2018   No results found for: HGBA1C     Assessment & Plan:   Problem List Items Addressed This Visit    Hyperlipidemia    Encouraged heart healthy diet, increase exercise, avoid trans fats, consider a krill oil cap daily      Obesity    Encouraged DASH diet, decrease po intake and increase exercise as tolerated. Needs 7-8 hours of sleep nightly. Avoid trans fats, eat small, frequent meals every 4-5 hours with lean proteins, complex carbs and healthy fats. Minimize simple carbs      Right hip pain    Encouraged moist heat and gentle stretching as tolerated. May try NSAIDs and prescription meds as directed and report if symptoms worsen or seek immediate care. Consider chiropractic and a muscle relaxer prn         I am having Janna Lieber start on tiZANidine. I am also having her maintain her multivitamin, aspirin, sertraline, oxybutynin, ALPRAZolam, and atorvastatin.  Meds ordered this encounter  Medications  . tiZANidine (ZANAFLEX) 4 MG tablet    Sig: Take 1 tablet (4 mg total) by mouth every 6 (six) hours as needed for  muscle spasms.    Dispense:  30 tablet    Refill:  2     Danise Edge, MD

## 2018-04-24 ENCOUNTER — Other Ambulatory Visit: Payer: Self-pay | Admitting: Cardiovascular Disease

## 2018-04-25 ENCOUNTER — Encounter: Payer: Self-pay | Admitting: Family Medicine

## 2018-05-05 DIAGNOSIS — M25571 Pain in right ankle and joints of right foot: Secondary | ICD-10-CM | POA: Diagnosis not present

## 2018-05-22 ENCOUNTER — Encounter: Payer: Self-pay | Admitting: Internal Medicine

## 2018-05-22 ENCOUNTER — Ambulatory Visit: Payer: BLUE CROSS/BLUE SHIELD | Admitting: Internal Medicine

## 2018-05-22 VITALS — BP 126/68 | HR 69 | Temp 97.9°F | Resp 16 | Ht 66.0 in | Wt 209.4 lb

## 2018-05-22 DIAGNOSIS — K645 Perianal venous thrombosis: Secondary | ICD-10-CM

## 2018-05-22 DIAGNOSIS — K649 Unspecified hemorrhoids: Secondary | ICD-10-CM | POA: Diagnosis not present

## 2018-05-22 MED ORDER — LIDOCAINE 5 % EX OINT: 1.0000 "application " | TOPICAL_OINTMENT | CUTANEOUS | 0 refills | Status: DC | PRN

## 2018-05-22 NOTE — Patient Instructions (Signed)
Bath sitz 2 or 3 times a day  Call if the area worsen or if you have severe bleeding   Lidocaine as needed for pain

## 2018-05-22 NOTE — Progress Notes (Signed)
Subjective:    Patient ID: Jessica Bradford, female    DOB: 1966/02/29, 52 y.o.   MRN: [id number]  DOS:  05/22/2018 Type of visit - description : acute Interval history: Sx started 2 days ago with rectal pain, reminded her of her previous thrombosed hemorrhoids. This morning, she noted some bleeding when she wiped. Went to urgent care, was told she had a clot but they were unable to "lancet" the area. Pain at some point was 8/10, not as severe today.  Review of Systems Denies nausea, vomiting, diarrhea  Past Medical History:  Diagnosis Date  . Anemia   . Anxiety   . Heart palpitations   . History of chicken pox   . Hyperlipidemia   . Incomplete right bundle branch block   . Mitral valve prolapse   . Urinary incontinence     Past Surgical History:  Procedure Laterality Date  . CHOLECYSTECTOMY    . TUBAL LIGATION  1996  . TUBAL LIGATION      Social History   Socioeconomic History  . Marital status: Married    Spouse name: Not on file  . Number of children: Not on file  . Years of education: Not on file  . Highest education level: Not on file  Occupational History  . Not on file  Social Needs  . Financial resource strain: Not on file  . Food insecurity:    Worry: Not on file    Inability: Not on file  . Transportation needs:    Medical: Not on file    Non-medical: Not on file  Tobacco Use  . Smoking status: Never Smoker  . Smokeless tobacco: Never Used  Substance and Sexual Activity  . Alcohol use: Yes    Alcohol/week: 2.0 standard drinks    Types: 2 Cans of beer per week  . Drug use: No  . Sexual activity: Yes    Birth control/protection: Surgical    Comment: Tubal  Lifestyle  . Physical activity:    Days per week: Not on file    Minutes per session: Not on file  . Stress: Not on file  Relationships  . Social connections:    Talks on phone: Not on file    Gets together: Not on file    Attends religious service: Not on file    Active member of  club or organization: Not on file    Attends meetings of clubs or organizations: Not on file    Relationship status: Not on file  . Intimate partner violence:    Fear of current or ex partner: Not on file    Emotionally abused: Not on file    Physically abused: Not on file    Forced sexual activity: Not on file  Other Topics Concern  . Not on file  Social History Narrative   Works for [employer], no major dietary restrictions, lives with husband and their dog      Allergies as of 05/22/2018      Reactions   Sulfa Antibiotics Rash   Doxycycline Rash   Sulfur Rash      Medication List        Accurate as of 05/22/18  2:09 PM. Always use your most recent med list.          ALPRAZolam 0.25 MG tablet Commonly known as:  XANAX Take 0.5 tablets by mouth daily as needed.   aspirin 81 MG tablet Take 81 mg by mouth daily.   atorvastatin 80 MG  tablet Commonly known as:  LIPITOR Take 1 tablet (80 mg total) by mouth daily.   multivitamin per tablet Take 1 tablet by mouth daily.   oxybutynin 15 MG 24 hr tablet Commonly known as:  DITROPAN XL Take 1 tablet by mouth daily.   sertraline 50 MG tablet Commonly known as:  ZOLOFT Take 1 tablet by mouth daily.   tiZANidine 4 MG tablet Commonly known as:  ZANAFLEX Take 1 tablet (4 mg total) by mouth every 6 (six) hours as needed for muscle spasms.          Objective:   Physical Exam BP 126/68 (BP Location: Left Arm, Patient Position: Sitting, Cuff Size: Normal)   Pulse 69   Temp 97.9 F (36.6 C) (Oral)   Resp 16   Ht 5\' 6"  (1.676 m)   Wt 209 lb 6 oz (95 kg)   SpO2 94%   BMI 33.79 kg/m   General:   Well developed, NAD, see BMI.  HEENT:  Normocephalic . Face symmetric, atraumatic Rectal exam: At this point I asked my colleague Dr. Patsy Lager to help me, she inspected the area, found a 1 cm external hemorrhoid, it was indeed clotted, after mild massage she was able to obtain a small clot.  The patient had immediate relief, no  major bleeding noted afterwards. Skin: Not pale. Not jaundice Neurologic:  alert & oriented X3.  Speech normal, gait appropriate for age and unassisted Psych--  Cognition and judgment appear intact.  Cooperative with normal attention span and concentration.  Behavior appropriate. No anxious or depressed appearing.      Assessment & Plan:   52 y/o female PMH includes high cholesterol, anxiety, status post tubal ligation presents with Thrombosed hemorrhoid: The hemorrhoid most likely was thrombosed and apparently started to drain this morning, the drainage was completed by my colleague Dr. Patsy Lager, appreciate her help. Recommend bath sitz, massage the area to prevent reaccumulation, lidocaine as needed, call if no better.

## 2018-05-22 NOTE — Progress Notes (Signed)
Pre visit review using our clinic review tool, if applicable. No additional management support is needed unless otherwise documented below in the visit note. 

## 2018-05-24 ENCOUNTER — Encounter: Payer: Self-pay | Admitting: Internal Medicine

## 2018-05-29 ENCOUNTER — Telehealth: Payer: Self-pay

## 2018-05-29 NOTE — Telephone Encounter (Signed)
PA for lidocaine ointment denied. Lidocaine ointment is approved for pain relief for:  -Areas of the mouth and throat -Insertion a breathing tube (intubation) -Pain associated with minor burns such as sunburn, scraping of skin, and insect bites (short-term only)   Pt has already purchased medication OOP.

## 2018-06-19 DIAGNOSIS — Z23 Encounter for immunization: Secondary | ICD-10-CM | POA: Diagnosis not present

## 2018-07-05 DIAGNOSIS — J209 Acute bronchitis, unspecified: Secondary | ICD-10-CM | POA: Diagnosis not present

## 2018-07-05 DIAGNOSIS — J0141 Acute recurrent pansinusitis: Secondary | ICD-10-CM | POA: Diagnosis not present

## 2018-07-25 ENCOUNTER — Ambulatory Visit (HOSPITAL_BASED_OUTPATIENT_CLINIC_OR_DEPARTMENT_OTHER)
Admission: RE | Admit: 2018-07-25 | Discharge: 2018-07-25 | Disposition: A | Discharge status: Home / Self Care | Payer: BLUE CROSS/BLUE SHIELD | Source: Ambulatory Visit | Attending: Family Medicine | Admitting: Family Medicine

## 2018-07-25 ENCOUNTER — Ambulatory Visit: Payer: BLUE CROSS/BLUE SHIELD | Admitting: Family Medicine

## 2018-07-25 DIAGNOSIS — J40 Bronchitis, not specified as acute or chronic: Secondary | ICD-10-CM | POA: Insufficient documentation

## 2018-07-25 DIAGNOSIS — R05 Cough: Secondary | ICD-10-CM | POA: Diagnosis not present

## 2018-07-25 MED ORDER — ALBUTEROL SULFATE HFA 108 (90 BASE) MCG/ACT IN AERS: 2.0000 | INHALATION_SPRAY | Freq: Four times a day (QID) | RESPIRATORY_TRACT | 2 refills | Status: DC | PRN

## 2018-07-25 MED ORDER — FAMOTIDINE 20 MG PO TABS: 20.0000 mg | ORAL_TABLET | Freq: Two times a day (BID) | ORAL | 2 refills | Status: DC

## 2018-07-25 MED ORDER — CEFDINIR 300 MG PO CAPS: 300.0000 mg | ORAL_CAPSULE | Freq: Two times a day (BID) | ORAL | 0 refills | Status: AC

## 2018-07-25 NOTE — Patient Instructions (Addendum)
Singulair is the prescription for allergies and asthma we can start if cough persists Acute Bronchitis, Adult Acute bronchitis is sudden (acute) swelling of the air tubes (bronchi) in the lungs. Acute bronchitis causes these tubes to fill with mucus, which can make it hard to breathe. It can also cause coughing or wheezing. In adults, acute bronchitis usually goes away within 2 weeks. A cough caused by bronchitis may last up to 3 weeks. Smoking, allergies, and asthma can make the condition worse. Repeated episodes of bronchitis may cause further lung problems, such as chronic obstructive pulmonary disease (COPD). What are the causes? This condition can be caused by germs and by substances that irritate the lungs, including:  Cold and flu viruses. This condition is most often caused by the same virus that causes a cold.  Bacteria.  Exposure to tobacco smoke, dust, fumes, and air pollution.  What increases the risk? This condition is more likely to develop in people who:  Have close contact with someone with acute bronchitis.  Are exposed to lung irritants, such as tobacco smoke, dust, fumes, and vapors.  Have a weak immune system.  Have a respiratory condition such as asthma.  What are the signs or symptoms? Symptoms of this condition include:  A cough.  Coughing up clear, yellow, or green mucus.  Wheezing.  Chest congestion.  Shortness of breath.  A fever.  Body aches.  Chills.  A sore throat.  How is this diagnosed? This condition is usually diagnosed with a physical exam. During the exam, your health care provider may order tests, such as chest X-rays, to rule out other conditions. He or she may also:  Test a sample of your mucus for bacterial infection.  Check the level of oxygen in your blood. This is done to check for pneumonia.  Do a chest X-ray or lung function testing to rule out pneumonia and other conditions.  Perform blood tests.  Your health care  provider will also ask about your symptoms and medical history. How is this treated? Most cases of acute bronchitis clear up over time without treatment. Your health care provider may recommend:  Drinking more fluids. Drinking more makes your mucus thinner, which may make it easier to breathe.  Taking a medicine for a fever or cough.  Taking an antibiotic medicine.  Using an inhaler to help improve shortness of breath and to control a cough.  Using a cool mist vaporizer or humidifier to make it easier to breathe.  Follow these instructions at home: Medicines  Take over-the-counter and prescription medicines only as told by your health care provider.  If you were prescribed an antibiotic, take it as told by your health care provider. Do not stop taking the antibiotic even if you start to feel better. General instructions  Get plenty of rest.  Drink enough fluids to keep your urine clear or pale yellow.  Avoid smoking and secondhand smoke. Exposure to cigarette smoke or irritating chemicals will make bronchitis worse. If you smoke and you need help quitting, ask your health care provider. Quitting smoking will help your lungs heal faster.  Use an inhaler, cool mist vaporizer, or humidifier as told by your health care provider.  Keep all follow-up visits as told by your health care provider. This is important. How is this prevented? To lower your risk of getting this condition again:  Wash your hands often with soap and water. If soap and water are not available, use hand sanitizer.  Avoid contact with  people who have cold symptoms.  Try not to touch your hands to your mouth, nose, or eyes.  Make sure to get the flu shot every year.  Contact a health care provider if:  Your symptoms do not improve in 2 weeks of treatment. Get help right away if:  You cough up blood.  You have chest pain.  You have severe shortness of breath.  You become dehydrated.  You faint or  keep feeling like you are going to faint.  You keep vomiting.  You have a severe headache.  Your fever or chills gets worse. This information is not intended to replace advice given to you by your health care provider. Make sure you discuss any questions you have with your health care provider. Document Released: 09/16/2004 Document Revised: 03/03/2016 Document Reviewed: 01/28/2016 [employer] Interactive Patient Education  2018 [employer]  Increase the [place] to twice daily or add Benadryl or zyrtec at night

## 2018-07-26 NOTE — Progress Notes (Signed)
Subjective:    Patient ID: Jessica Bradford, female    DOB: [birth year], 52 y.o.   MRN: [id number]  No chief complaint on file.   HPI Patient is in today for evaluation of cough. Patient reports cough since this past July.  She had a bad bronchitis was treated with a Z-Pak and prednisone as well as albuterol.  Symptoms improved for the most part but the cough persisted then last month she developed some increased head congestion postnasal drip ultimately was seen in urgent care and treated with amoxicillin and albuterol which she says did not touch her symptoms she is still struggling with postnasal drip cough malaise.  She denies other reflux or allergic symptoms but it is possible the cough is multifactorial.  Denies CP/palp/SOB/HA/fevers/GI or GU c/o. Taking meds as prescribed  Past Medical History:  Diagnosis Date  . Anemia   . Anxiety   . Heart palpitations   . History of chicken pox   . Hyperlipidemia   . Incomplete right bundle branch block   . Mitral valve prolapse   . Urinary incontinence     Past Surgical History:  Procedure Laterality Date  . CHOLECYSTECTOMY    . TUBAL LIGATION  1996  . TUBAL LIGATION      Family History  Problem Relation Age of Onset  . Stroke Father   . Hypertension Father   . Alcohol abuse Father   . Heart attack Mother   . Alcohol abuse Sister   . Alcohol abuse Brother   . Drug abuse Brother        Heroin  . Heart attack Brother   . Alcohol abuse Brother   . Drug abuse Brother        THC  . Heart disease Maternal Grandmother   . Heart disease Maternal Grandfather   . Hypertension Paternal Grandmother   . Dementia Paternal Grandmother   . Cancer Paternal Grandfather 73       lung, smoker  . Other Paternal Grandfather        noncancerous tumor    Social History   Socioeconomic History  . Marital status: Married    Spouse name: Not on file  . Number of children: Not on file  . Years of education: Not on file  . Highest  education level: Not on file  Occupational History  . Not on file  Social Needs  . Financial resource strain: Not on file  . Food insecurity:    Worry: Not on file    Inability: Not on file  . Transportation needs:    Medical: Not on file    Non-medical: Not on file  Tobacco Use  . Smoking status: Never Smoker  . Smokeless tobacco: Never Used  Substance and Sexual Activity  . Alcohol use: Yes    Alcohol/week: 2.0 standard drinks    Types: 2 Cans of beer per week  . Drug use: No  . Sexual activity: Yes    Birth control/protection: Surgical    Comment: Tubal  Lifestyle  . Physical activity:    Days per week: Not on file    Minutes per session: Not on file  . Stress: Not on file  Relationships  . Social connections:    Talks on phone: Not on file    Gets together: Not on file    Attends religious service: Not on file    Active member of club or organization: Not on file    Attends meetings of clubs  or organizations: Not on file    Relationship status: Not on file  . Intimate partner violence:    Fear of current or ex partner: Not on file    Emotionally abused: Not on file    Physically abused: Not on file    Forced sexual activity: Not on file  Other Topics Concern  . Not on file  Social History Narrative   Works for [employer], no major dietary restrictions, lives with husband and their dog    Outpatient Medications Prior to Visit  Medication Sig Dispense Refill  . ALPRAZolam (XANAX) 0.25 MG tablet Take 0.5 tablets by mouth daily as needed.  1  . aspirin 81 MG tablet Take 81 mg by mouth daily.      Marland Kitchen atorvastatin (LIPITOR) 80 MG tablet Take 1 tablet (80 mg total) by mouth daily. 90 tablet 3  . lidocaine (XYLOCAINE) 5 % ointment Apply 1 application topically as needed. 35.44 g 0  . multivitamin (THERAGRAN) per tablet Take 1 tablet by mouth daily.      Marland Kitchen oxybutynin (DITROPAN XL) 15 MG 24 hr tablet Take 1 tablet by mouth daily.  3  . sertraline (ZOLOFT) 50 MG tablet Take 1  tablet by mouth daily.  2  . tiZANidine (ZANAFLEX) 4 MG tablet Take 1 tablet (4 mg total) by mouth every 6 (six) hours as needed for muscle spasms. 30 tablet 2   No facility-administered medications prior to visit.     Allergies  Allergen Reactions  . Sulfa Antibiotics Rash  . Doxycycline Rash  . Sulfur Rash    Review of Systems  Constitutional: Positive for malaise/fatigue. Negative for fever.  HENT: Positive for congestion.   Eyes: Negative for blurred vision.  Respiratory: Positive for cough. Negative for shortness of breath.   Cardiovascular: Negative for chest pain, palpitations and leg swelling.  Gastrointestinal: Negative for abdominal pain, blood in stool and nausea.  Genitourinary: Negative for dysuria and frequency.  Musculoskeletal: Negative for falls.  Skin: Negative for rash.  Neurological: Negative for dizziness, loss of consciousness and headaches.  Endo/Heme/Allergies: Negative for environmental allergies.  Psychiatric/Behavioral: Negative for depression. The patient is not nervous/anxious.        Objective:    Physical Exam  Constitutional: She is oriented to person, place, and time. She appears well-developed and well-nourished. No distress.  HENT:  Head: Normocephalic and atraumatic.  Nose: Nose normal.  Eyes: Right eye exhibits no discharge. Left eye exhibits no discharge.  Neck: Normal range of motion. Neck supple.  Cardiovascular: Normal rate and regular rhythm.  No murmur heard. Pulmonary/Chest: Effort normal and breath sounds normal.  Abdominal: Soft. Bowel sounds are normal. There is no tenderness.  Musculoskeletal: She exhibits no edema.  Neurological: She is alert and oriented to person, place, and time.  Skin: Skin is warm and dry.  Psychiatric: She has a normal mood and affect.  Nursing note and vitals reviewed.   BP 108/76 (BP Location: Left Arm, Patient Position: Sitting, Cuff Size: Normal)   Pulse 98   Temp 98.2 F (36.8 C) (Oral)    Resp 18   Wt 217 lb 3.2 oz (98.5 kg)   SpO2 99%   BMI 35.06 kg/m  Wt Readings from Last 3 Encounters:  07/25/18 217 lb 3.2 oz (98.5 kg)  05/22/18 209 lb 6 oz (95 kg)  04/13/18 209 lb 3.2 oz (94.9 kg)     Lab Results  Component Value Date   GLUCOSE 95 02/16/2018   CHOL 146  02/16/2018   TRIG 77 02/16/2018   HDL 57 02/16/2018   LDLCALC 74 02/16/2018   ALT 27 02/16/2018   AST 27 02/16/2018   NA 139 02/16/2018   K 4.9 02/16/2018   CL 101 02/16/2018   CREATININE 0.94 02/16/2018   BUN 18 02/16/2018   CO2 26 02/16/2018   TSH 1.71 10/14/2017    Lab Results  Component Value Date   TSH 1.71 10/14/2017   No results found for: WBC, HGB, HCT, MCV, PLT Lab Results  Component Value Date   NA 139 02/16/2018   K 4.9 02/16/2018   CO2 26 02/16/2018   GLUCOSE 95 02/16/2018   BUN 18 02/16/2018   CREATININE 0.94 02/16/2018   BILITOT 0.6 02/16/2018   ALKPHOS 70 02/16/2018   AST 27 02/16/2018   ALT 27 02/16/2018   PROT 6.3 02/16/2018   ALBUMIN 4.1 02/16/2018   CALCIUM 9.5 02/16/2018   GFR 68.68 10/14/2017   Lab Results  Component Value Date   CHOL 146 02/16/2018   Lab Results  Component Value Date   HDL 57 02/16/2018   Lab Results  Component Value Date   LDLCALC 74 02/16/2018   Lab Results  Component Value Date   TRIG 77 02/16/2018   Lab Results  Component Value Date   CHOLHDL 2.6 02/16/2018   No results found for: HGBA1C     Assessment & Plan:   Problem List Items Addressed This Visit    Bronchitis    Patient reports cough since this past July.  She had a bad bronchitis was treated with a Z-Pak and prednisone as well as albuterol.  Symptoms improved for the most part but the cough persisted then last month she developed some increased head congestion postnasal drip ultimately was seen in urgent care and treated with amoxicillin and albuterol which she says did not touch her symptoms she is still struggling with postnasal drip cough malaise.  She denies other  reflux or allergic symptoms but it is possible the cough is multifactorial.  Will proceed with chest x-ray, cefdinir, Mucinex and will simultaneously treat allergies and reflux with twice daily antihistamine Allegra in the morning and Benadryl at night and Pepcid 20 mg twice daily and reassess cough in 1 month.  Consider addition of Singulair.  If cough persist will refer to ENT. Spent 20 minutes with patient in evaluation, examination and treatment      Relevant Orders   DG Chest 2 View      I am having Jeanet L. Syring start on famotidine, albuterol, and cefdinir. I am also having her maintain her multivitamin, aspirin, sertraline, oxybutynin, ALPRAZolam, atorvastatin, tiZANidine, and lidocaine.  Meds ordered this encounter  Medications  . famotidine (PEPCID) 20 MG tablet    Sig: Take 1 tablet (20 mg total) by mouth 2 (two) times daily.    Dispense:  60 tablet    Refill:  2  . albuterol (PROVENTIL HFA;VENTOLIN HFA) 108 (90 Base) MCG/ACT inhaler    Sig: Inhale 2 puffs into the lungs every 6 (six) hours as needed for wheezing or shortness of breath.    Dispense:  1 Inhaler    Refill:  2  . cefdinir (OMNICEF) 300 MG capsule    Sig: Take 1 capsule (300 mg total) by mouth 2 (two) times daily for 10 days.    Dispense:  20 capsule    Refill:  0      Danise EdgeStacey Jatavion Peaster, MD

## 2018-07-26 NOTE — Assessment & Plan Note (Signed)
Patient reports cough since this past July.  She had a bad bronchitis was treated with a Z-Pak and prednisone as well as albuterol.  Symptoms improved for the most part but the cough persisted then last month she developed some increased head congestion postnasal drip ultimately was seen in urgent care and treated with amoxicillin and albuterol which she says did not touch her symptoms she is still struggling with postnasal drip cough malaise.  She denies other reflux or allergic symptoms but it is possible the cough is multifactorial.  Will proceed with chest x-ray, cefdinir, Mucinex and will simultaneously treat allergies and reflux with twice daily antihistamine Allegra in the morning and Benadryl at night and Pepcid 20 mg twice daily and reassess cough in 1 month.  Consider addition of Singulair.  If cough persist will refer to ENT. Spent 20 minutes with patient in evaluation, examination and treatment

## 2018-08-10 ENCOUNTER — Ambulatory Visit: Payer: BLUE CROSS/BLUE SHIELD | Admitting: Medical

## 2018-08-10 ENCOUNTER — Encounter: Payer: Self-pay | Admitting: Medical

## 2018-08-10 VITALS — BP 110/56 | HR 74 | Temp 98.2°F | Resp 16 | Ht 66.0 in | Wt 214.5 lb

## 2018-08-10 DIAGNOSIS — B349 Viral infection, unspecified: Secondary | ICD-10-CM | POA: Diagnosis not present

## 2018-08-10 LAB — POCT INFLUENZA A/B
Influenza A, POC: NEGATIVE
Influenza B, POC: NEGATIVE

## 2018-08-10 MED ORDER — FLUTICASONE PROPIONATE 50 MCG/ACT NA SUSP: 2.0000 | Freq: Every day | NASAL | 1 refills | Status: DC

## 2018-08-10 MED ORDER — OSELTAMIVIR PHOSPHATE 75 MG PO CAPS: 75.0000 mg | ORAL_CAPSULE | Freq: Two times a day (BID) | ORAL | 0 refills | Status: DC

## 2018-08-10 MED ORDER — AZITHROMYCIN 250 MG PO TABS: ORAL_TABLET | ORAL | 0 refills | Status: DC

## 2018-08-10 MED ORDER — BENZONATATE 100 MG PO CAPS: 100.0000 mg | ORAL_CAPSULE | Freq: Three times a day (TID) | ORAL | 0 refills | Status: DC | PRN

## 2018-08-10 NOTE — Patient Instructions (Addendum)
You appear to have the flu even though your  rapid flu test was negative.(Important to note sometimes flu test can be falsely negative.) Therefore,  I am treating you with tamiflu based on your clinical presentation. Rest, hydrate and take tylenol for fever. Alternate ibuprofen  if necessary for body ache or fever. You should gradually improve.   If you develop secondary bacterial infection signs and symptoms as discussed then can start azithromycin(print rx given). Currently out of rapid strep test. Throat presentation not real suspicious for strep presently.  For cough rx benzonatate.  For nasal congestion, rx flonase.  Follow up in 7-10 days or as needed

## 2018-08-10 NOTE — Progress Notes (Signed)
   Subjective:    Patient ID: Jessica Bradford, female    DOB: 1966-01-08, 52 y.o.   MRN: [id number]  HPI  Pt in today for some body aches, fever, chills and sweats. Pt symptoms started on Tuesday morning. St has been prominent on first day but minimal now.  Pt niece just diagnosed with flu today. She was hugging niece this weekend.   Pt did get flu vaccine this year.   Pt finished antibiotic on 13th. Jul 26, 2018 cxr was clear.She was on  omnicef for bronchtis.       Review of Systems  Constitutional: Positive for chills, fatigue and fever.  HENT: Positive for congestion and sore throat. Negative for ear pain, mouth sores, postnasal drip and sinus pain.        Mild st now. But worse on Tuesday.  Respiratory: Positive for cough. Negative for chest tightness, wheezing and stridor.        Pt has some cough with other symptoms.   Cardiovascular: Negative for chest pain and palpitations.  Gastrointestinal: Negative for abdominal pain.  Genitourinary: Negative for dysuria, flank pain and frequency.  Musculoskeletal: Positive for myalgias. Negative for back pain and joint swelling.  Neurological: Negative for syncope, facial asymmetry, weakness and headaches.  Hematological: Negative for adenopathy. Does not bruise/bleed easily.  Psychiatric/Behavioral: Negative for behavioral problems and confusion.       Objective:   Physical Exam  General  Mental Status - Alert. General Appearance - Well groomed. Not in acute distress.  Skin Rashes- No Rashes.  HEENT Head- Normal. Ear Auditory Canal - Left- Normal. Right - Normal.Tympanic Membrane- Left- Normal. Right- Normal. Eye Sclera/Conjunctiva- Left- Normal. Right- Normal. Nose & Sinuses Nasal Mucosa- Left-  Boggy and Congested. Right-  Boggy and  Congested.Bilateral maxillary and frontal sinus pressure. Mouth & Throat Lips: Upper Lip- Normal: no dryness, cracking, pallor, cyanosis, or vesicular eruption. Lower Lip-Normal: no  dryness, cracking, pallor, cyanosis or vesicular eruption. Buccal Mucosa- Bilateral- No Aphthous ulcers. Oropharynx- No Discharge or Erythema. Tonsils: Characteristics- Bilateral- mild Erythema or Congestion. Size/Enlargement- Bilateral- No enlargement. Discharge- bilateral-None.  Neck Neck- Supple. No Masses.   Chest and Lung Exam Auscultation: Breath Sounds:-Clear even and unlabored.  Cardiovascular Auscultation:Rythm- Regular, rate and rhythm. Murmurs & Other Heart Sounds:Ausculatation of the heart reveal- No Murmurs.  Lymphatic Head & Neck General Head & Neck Lymphatics: Bilateral: Description- No Localized lymphadenopathy.       Assessment & Plan:  You appear to have the flu even though your  rapid flu test was negative.(Important to note sometimes flu test can be falsely negative.) Therefore,  I am treating you with tamiflu based on your clinical presentation. Rest, hydrate and take tylenol for fever. Alternate ibuprofen  if necessary for body ache or fever. You should gradually improve.   If you develop secondary bacterial infection signs and symptoms as discussed then can start azithromycin(print rx given). Currently out of rapid strep test. Throat presentation not real suspicious for strep presently.  For cough rx benzonatate.  For nasal congestion, rx flonase.  Follow up in 7-10 days or as needed  Whole FoodsEdward Lauriana Denes, VF CorporationPA-C

## 2018-08-10 NOTE — Progress Notes (Signed)
Pre visit review using our clinic review tool, if applicable. No additional management support is needed unless otherwise documented below in the visit note. 

## 2018-08-23 DIAGNOSIS — J45909 Unspecified asthma, uncomplicated: Secondary | ICD-10-CM

## 2018-08-23 HISTORY — DX: Unspecified asthma, uncomplicated: J45.909

## 2018-08-28 ENCOUNTER — Encounter: Payer: Self-pay | Admitting: Medical

## 2018-08-28 ENCOUNTER — Ambulatory Visit (HOSPITAL_BASED_OUTPATIENT_CLINIC_OR_DEPARTMENT_OTHER)
Admission: RE | Admit: 2018-08-28 | Discharge: 2018-08-28 | Disposition: A | Discharge status: Home / Self Care | Payer: BLUE CROSS/BLUE SHIELD | Source: Ambulatory Visit | Attending: Medical | Admitting: Medical

## 2018-08-28 ENCOUNTER — Ambulatory Visit: Payer: BLUE CROSS/BLUE SHIELD | Admitting: Medical

## 2018-08-28 VITALS — BP 109/65 | HR 59 | Temp 98.3°F | Resp 16 | Ht 66.0 in | Wt 216.6 lb

## 2018-08-28 DIAGNOSIS — R062 Wheezing: Secondary | ICD-10-CM | POA: Diagnosis not present

## 2018-08-28 DIAGNOSIS — R05 Cough: Secondary | ICD-10-CM | POA: Diagnosis not present

## 2018-08-28 DIAGNOSIS — R0982 Postnasal drip: Secondary | ICD-10-CM

## 2018-08-28 DIAGNOSIS — R059 Cough, unspecified: Secondary | ICD-10-CM

## 2018-08-28 MED ORDER — MONTELUKAST SODIUM 10 MG PO TABS: 10.0000 mg | ORAL_TABLET | Freq: Every day | ORAL | 3 refills | Status: DC

## 2018-08-28 MED ORDER — PREDNISONE 10 MG (21) PO TBPK: ORAL_TABLET | ORAL | 0 refills | Status: DC

## 2018-08-28 MED ORDER — HYDROCODONE-HOMATROPINE 5-1.5 MG/5ML PO SYRP: 5.0000 mL | ORAL_SOLUTION | Freq: Four times a day (QID) | ORAL | 0 refills | Status: DC | PRN

## 2018-08-28 NOTE — Progress Notes (Signed)
Subjective:    Patient ID: Jessica Bradford, female    DOB: 24-Oct-1965, 53 y.o.   MRN: [id number]  HPI  Pt in states she has residual cough that is lingering. She has been hearing herself wheeze. She states having to use albuterol. Using albuterol 2-3 times a day. She states she is waking up early in morning sometimes 3-4 am with wheezing.  Pt was still coughing at night even though she was using benzonatate. Pt not allergic to hydrocodone.   Pt states 3 days after I saw her she did start zpack. Pt also took tamiflu early on when had viral syndrome symptoms.  Pt chest xray on 07-26-2018 was negative.  Pt leaving out of town Thursday or Friday.  Pt never smoked.     Review of Systems  Constitutional: Negative for chills, fatigue and fever.  HENT: Positive for postnasal drip. Negative for congestion, ear pain, facial swelling, sinus pressure and sore throat.        Back in November she states felt a lot of pnd.  Respiratory: Positive for cough and wheezing. Negative for shortness of breath.   Cardiovascular: Negative for chest pain and palpitations.  Gastrointestinal: Negative for abdominal pain, blood in stool, diarrhea and rectal pain.  Musculoskeletal: Negative for back pain.  Skin: Negative for rash.  Neurological: Negative for dizziness, seizures and headaches.  Psychiatric/Behavioral: Negative for confusion.    Past Medical History:  Diagnosis Date  . Anemia   . Anxiety   . Heart palpitations   . History of chicken pox   . Hyperlipidemia   . Incomplete right bundle branch block   . Mitral valve prolapse   . Urinary incontinence      Social History   Socioeconomic History  . Marital status: Married    Spouse name: Not on file  . Number of children: Not on file  . Years of education: Not on file  . Highest education level: Not on file  Occupational History  . Not on file  Social Needs  . Financial resource strain: Not on file  . Food insecurity:   Worry: Not on file    Inability: Not on file  . Transportation needs:    Medical: Not on file    Non-medical: Not on file  Tobacco Use  . Smoking status: Never Smoker  . Smokeless tobacco: Never Used  Substance and Sexual Activity  . Alcohol use: Yes    Alcohol/week: 2.0 standard drinks    Types: 2 Cans of beer per week  . Drug use: No  . Sexual activity: Yes    Birth control/protection: Surgical    Comment: Tubal  Lifestyle  . Physical activity:    Days per week: Not on file    Minutes per session: Not on file  . Stress: Not on file  Relationships  . Social connections:    Talks on phone: Not on file    Gets together: Not on file    Attends religious service: Not on file    Active member of club or organization: Not on file    Attends meetings of clubs or organizations: Not on file    Relationship status: Not on file  . Intimate partner violence:    Fear of current or ex partner: Not on file    Emotionally abused: Not on file    Physically abused: Not on file    Forced sexual activity: Not on file  Other Topics Concern  . Not on file  Social History Narrative   Works for [employer], no major dietary restrictions, lives with husband and their dog    Past Surgical History:  Procedure Laterality Date  . CHOLECYSTECTOMY    . TUBAL LIGATION  1996  . TUBAL LIGATION      Family History  Problem Relation Age of Onset  . Stroke Father   . Hypertension Father   . Alcohol abuse Father   . Heart attack Mother   . Alcohol abuse Sister   . Alcohol abuse Brother   . Drug abuse Brother        Heroin  . Heart attack Brother   . Alcohol abuse Brother   . Drug abuse Brother        THC  . Heart disease Maternal Grandmother   . Heart disease Maternal Grandfather   . Hypertension Paternal Grandmother   . Dementia Paternal Grandmother   . Cancer Paternal Grandfather 59       lung, smoker  . Other Paternal Grandfather        noncancerous tumor    Allergies  Allergen  Reactions  . Sulfa Antibiotics Rash  . Doxycycline Rash  . Sulfur Rash    Current Outpatient Medications on File Prior to Visit  Medication Sig Dispense Refill  . albuterol (PROVENTIL HFA;VENTOLIN HFA) 108 (90 Base) MCG/ACT inhaler Inhale 2 puffs into the lungs every 6 (six) hours as needed for wheezing or shortness of breath. 1 Inhaler 2  . ALPRAZolam (XANAX) 0.25 MG tablet Take 0.5 tablets by mouth daily as needed.  1  . aspirin 81 MG tablet Take 81 mg by mouth daily.      Marland Kitchen atorvastatin (LIPITOR) 80 MG tablet Take 1 tablet (80 mg total) by mouth daily. 90 tablet 3  . azithromycin (ZITHROMAX) 250 MG tablet Take 2 tablets by mouth on day 1, followed by 1 tablet by mouth daily for 4 days. 6 tablet 0  . benzonatate (TESSALON) 100 MG capsule Take 1 capsule (100 mg total) by mouth 3 (three) times daily as needed for cough. 30 capsule 0  . famotidine (PEPCID) 20 MG tablet Take 1 tablet (20 mg total) by mouth 2 (two) times daily. 60 tablet 2  . fluticasone (FLONASE) 50 MCG/ACT nasal spray Place 2 sprays into both nostrils daily. 16 g 1  . lidocaine (XYLOCAINE) 5 % ointment Apply 1 application topically as needed. 35.44 g 0  . multivitamin (THERAGRAN) per tablet Take 1 tablet by mouth daily.      Marland Kitchen oseltamivir (TAMIFLU) 75 MG capsule Take 1 capsule (75 mg total) by mouth 2 (two) times daily. 10 capsule 0  . oxybutynin (DITROPAN XL) 15 MG 24 hr tablet Take 1 tablet by mouth daily.  3  . sertraline (ZOLOFT) 50 MG tablet Take 1 tablet by mouth daily.  2  . tiZANidine (ZANAFLEX) 4 MG tablet Take 1 tablet (4 mg total) by mouth every 6 (six) hours as needed for muscle spasms. 30 tablet 2   No current facility-administered medications on file prior to visit.     BP 109/65   Pulse (!) 59   Temp 98.3 F (36.8 C) (Oral)   Resp 16   Ht 5\' 6"  (1.676 m)   Wt 216 lb 9.6 oz (98.2 kg)   SpO2 100%   BMI 34.96 kg/m       Objective:   Physical Exam  General  Mental Status - Alert. General  Appearance - Well groomed. Not in acute distress.  Skin  Rashes- No Rashes.  HEENT Head- Normal. Ear Auditory Canal - Left- Normal. Right - Normal.Tympanic Membrane- Left- Normal. Right- Normal. Eye Sclera/Conjunctiva- Left- Normal. Right- Normal. Nose & Sinuses Nasal Mucosa- Left-  Boggy and Congested. Right-  Boggy and  Congested.Bilateral no  maxillary and no frontal sinus pressure. Mouth & Throat Lips: Upper Lip- Normal: no dryness, cracking, pallor, cyanosis, or vesicular eruption. Lower Lip-Normal: no dryness, cracking, pallor, cyanosis or vesicular eruption. Buccal Mucosa- Bilateral- No Aphthous ulcers. Oropharynx- No Discharge or Erythema. +pnd. Tonsils: Characteristics- Bilateral- No Erythema or Congestion. Size/Enlargement- Bilateral- No enlargement. Discharge- bilateral-None.  Neck Neck- Supple. No Masses.   Chest and Lung Exam Auscultation: Breath Sounds:-even and unlabored. Faint expiratory wheeze.  Cardiovascular Auscultation:Rythm- Regular, rate and rhythm. Murmurs & Other Heart Sounds:Ausculatation of the heart reveal- No Murmurs.  Lymphatic Head & Neck General Head & Neck Lymphatics: Bilateral: Description- No Localized lymphadenopathy.         Assessment & Plan:  For history of chronic cough, viral syndrome and recent wheezing, I want to get chest x-ray today to rule out any infectious cause.  Recent symptoms appear to be more in line with reactive airways post viral syndrome versus possible allergies.  Please start 6-day taper prednisone today and continue albuterol as needed.  For severe cough, I am making Hycodan available.  Rx advisement given.  You can continue your current allergy medication regimen but recommend holding off on Benadryl.  Will prescribe montelukast to use in 10 days provided your cough improved with above measures but then returns after you stop prednisone.  Follow-up in 10 days office visit or by my chart.  Esperanza RichtersEdward Jaquelinne Glendening,  PA-C

## 2018-08-28 NOTE — Patient Instructions (Signed)
For history of chronic cough, viral syndrome and recent wheezing, I want to get chest x-ray today to rule out any infectious cause.  Recent symptoms appear to be more in line with reactive airways post viral syndrome versus possible allergies.  Please start 6-day taper prednisone today and continue albuterol as needed.  For severe cough, I am making Hycodan available.  Rx advisement given.  You can continue your current allergy medication regimen but recommend holding off on Benadryl.  Will prescribe montelukast to use in 10 days provided your cough improved with above measures but then returns after you stop prednisone.  Follow-up in 10 days office visit or by my chart.

## 2018-09-04 ENCOUNTER — Encounter: Payer: Self-pay | Admitting: Medical

## 2018-10-09 DIAGNOSIS — Z1231 Encounter for screening mammogram for malignant neoplasm of breast: Secondary | ICD-10-CM | POA: Diagnosis not present

## 2018-10-09 DIAGNOSIS — Z01419 Encounter for gynecological examination (general) (routine) without abnormal findings: Secondary | ICD-10-CM | POA: Diagnosis not present

## 2018-10-09 DIAGNOSIS — Z124 Encounter for screening for malignant neoplasm of cervix: Secondary | ICD-10-CM | POA: Diagnosis not present

## 2018-10-09 DIAGNOSIS — Z6835 Body mass index (BMI) 35.0-35.9, adult: Secondary | ICD-10-CM | POA: Diagnosis not present

## 2018-10-09 DIAGNOSIS — R6882 Decreased libido: Secondary | ICD-10-CM | POA: Diagnosis not present

## 2018-10-10 LAB — HM MAMMOGRAPHY

## 2018-10-11 ENCOUNTER — Other Ambulatory Visit: Payer: Self-pay | Admitting: Family Medicine

## 2018-10-16 ENCOUNTER — Encounter: Payer: Self-pay | Admitting: Family Medicine

## 2018-10-16 ENCOUNTER — Ambulatory Visit (INDEPENDENT_AMBULATORY_CARE_PROVIDER_SITE_OTHER): Payer: BLUE CROSS/BLUE SHIELD | Admitting: Family Medicine

## 2018-10-16 ENCOUNTER — Telehealth: Payer: Self-pay | Admitting: *Deleted

## 2018-10-16 VITALS — BP 108/86 | HR 67 | Temp 98.3°F | Resp 18 | Ht 66.0 in | Wt 219.0 lb

## 2018-10-16 DIAGNOSIS — D649 Anemia, unspecified: Secondary | ICD-10-CM

## 2018-10-16 DIAGNOSIS — Z0001 Encounter for general adult medical examination with abnormal findings: Secondary | ICD-10-CM

## 2018-10-16 DIAGNOSIS — K649 Unspecified hemorrhoids: Secondary | ICD-10-CM

## 2018-10-16 DIAGNOSIS — Z7289 Other problems related to lifestyle: Secondary | ICD-10-CM | POA: Diagnosis not present

## 2018-10-16 DIAGNOSIS — I341 Nonrheumatic mitral (valve) prolapse: Secondary | ICD-10-CM

## 2018-10-16 DIAGNOSIS — F419 Anxiety disorder, unspecified: Secondary | ICD-10-CM | POA: Insufficient documentation

## 2018-10-16 DIAGNOSIS — T7840XD Allergy, unspecified, subsequent encounter: Secondary | ICD-10-CM | POA: Diagnosis not present

## 2018-10-16 DIAGNOSIS — E785 Hyperlipidemia, unspecified: Secondary | ICD-10-CM

## 2018-10-16 DIAGNOSIS — R7989 Other specified abnormal findings of blood chemistry: Secondary | ICD-10-CM

## 2018-10-16 DIAGNOSIS — J40 Bronchitis, not specified as acute or chronic: Secondary | ICD-10-CM

## 2018-10-16 DIAGNOSIS — E669 Obesity, unspecified: Secondary | ICD-10-CM

## 2018-10-16 DIAGNOSIS — Z0000 Encounter for general adult medical examination without abnormal findings: Secondary | ICD-10-CM

## 2018-10-16 LAB — COMPREHENSIVE METABOLIC PANEL
ALT: 22 U/L (ref 0–35)
AST: 20 U/L (ref 0–37)
Albumin: 4.3 g/dL (ref 3.5–5.2)
Alkaline Phosphatase: 58 U/L (ref 39–117)
BUN: 20 mg/dL (ref 6–23)
CO2: 29 mEq/L (ref 19–32)
Calcium: 9.5 mg/dL (ref 8.4–10.5)
Chloride: 106 mEq/L (ref 96–112)
Creatinine, Ser: 0.92 mg/dL (ref 0.40–1.20)
GFR: 63.57 mL/min (ref >60.00)
Glucose, Bld: 91 mg/dL (ref 70–99)
Potassium: 4.6 mEq/L (ref 3.5–5.1)
Sodium: 142 mEq/L (ref 135–145)
Total Bilirubin: 0.7 mg/dL (ref 0.2–1.2)
Total Protein: 6.4 g/dL (ref 6.0–8.3)

## 2018-10-16 LAB — LIPID PANEL
Cholesterol: 145 mg/dL (ref 0–200)
HDL: 64.6 mg/dL (ref >39.00)
LDL Cholesterol: 68 mg/dL (ref 0–99)
NonHDL: 80.02
TRIGLYCERIDES: 58 mg/dL (ref 0.0–149.0)
Total CHOL/HDL Ratio: 2
VLDL: 11.6 mg/dL (ref 0.0–40.0)

## 2018-10-16 LAB — CBC WITH DIFFERENTIAL/PLATELET
Basophils Absolute: 0.1 10*3/uL (ref 0.0–0.1)
Basophils Relative: 0.8 % (ref 0.0–3.0)
EOS ABS: 0.7 10*3/uL (ref 0.0–0.7)
EOS PCT: 10.5 % — AB (ref 0.0–5.0)
HCT: 39.3 % (ref 36.0–46.0)
Hemoglobin: 13.2 g/dL (ref 12.0–15.0)
Lymphocytes Relative: 29.2 % (ref 12.0–46.0)
Lymphs Abs: 1.9 10*3/uL (ref 0.7–4.0)
MCHC: 33.7 g/dL (ref 30.0–36.0)
MCV: 89.8 fl (ref 78.0–100.0)
MONO ABS: 0.5 10*3/uL (ref 0.1–1.0)
Monocytes Relative: 8.2 % (ref 3.0–12.0)
Neutro Abs: 3.3 10*3/uL (ref 1.4–7.7)
Neutrophils Relative %: 51.3 % (ref 43.0–77.0)
Platelets: 173 10*3/uL (ref 150.0–400.0)
RBC: 4.37 Mil/uL (ref 3.87–5.11)
RDW: 12.8 % (ref 11.5–15.5)
WBC: 6.4 10*3/uL (ref 4.0–10.5)

## 2018-10-16 LAB — TSH: TSH: 2.14 u[IU]/mL (ref 0.35–4.50)

## 2018-10-16 LAB — T4, FREE: Free T4: 1.01 ng/dL (ref 0.60–1.60)

## 2018-10-16 NOTE — Assessment & Plan Note (Signed)
Doing well on Sertraline

## 2018-10-16 NOTE — Assessment & Plan Note (Signed)
Improving but frustrated with persistence since October. She is referred to allergist.

## 2018-10-16 NOTE — Assessment & Plan Note (Signed)
Follows with Dr Melburn Popper

## 2018-10-16 NOTE — Assessment & Plan Note (Signed)
Encouraged DASH diet, decrease po intake and increase exercise as tolerated. Needs 7-8 hours of sleep nightly. Avoid trans fats, eat small, frequent meals every 4-5 hours with lean proteins, complex carbs and healthy fats. Minimize simple carbs 

## 2018-10-16 NOTE — Patient Instructions (Signed)
Preventive Care 40-64 Years, Female Preventive care refers to lifestyle choices and visits with your health care provider that can promote health and wellness. What does preventive care include?   A yearly physical exam. This is also called an annual well check.  Dental exams once or twice a year.  Routine eye exams. Ask your health care provider how often you should have your eyes checked.  Personal lifestyle choices, including: ? Daily care of your teeth and gums. ? Regular physical activity. ? Eating a healthy diet. ? Avoiding tobacco and drug use. ? Limiting alcohol use. ? Practicing safe sex. ? Taking low-dose aspirin daily starting at age 50. ? Taking vitamin and mineral supplements as recommended by your health care provider. What happens during an annual well check? The services and screenings done by your health care provider during your annual well check will depend on your age, overall health, lifestyle risk factors, and family history of disease. Counseling Your health care provider may ask you questions about your:  Alcohol use.  Tobacco use.  Drug use.  Emotional well-being.  Home and relationship well-being.  Sexual activity.  Eating habits.  Work and work environment.  Method of birth control.  Menstrual cycle.  Pregnancy history. Screening You may have the following tests or measurements:  Height, weight, and BMI.  Blood pressure.  Lipid and cholesterol levels. These may be checked every 5 years, or more frequently if you are over 50 years old.  Skin check.  Lung cancer screening. You may have this screening every year starting at age 55 if you have a 30-pack-year history of smoking and currently smoke or have quit within the past 15 years.  Colorectal cancer screening. All adults should have this screening starting at age 50 and continuing until age 75. Your health care provider may recommend screening at age 45. You will have tests every  1-10 years, depending on your results and the type of screening test. People at increased risk should start screening at an earlier age. Screening tests may include: ? Guaiac-based fecal occult blood testing. ? Fecal immunochemical test (FIT). ? Stool DNA test. ? Virtual colonoscopy. ? Sigmoidoscopy. During this test, a flexible tube with a tiny camera (sigmoidoscope) is used to examine your rectum and lower colon. The sigmoidoscope is inserted through your anus into your rectum and lower colon. ? Colonoscopy. During this test, a long, thin, flexible tube with a tiny camera (colonoscope) is used to examine your entire colon and rectum.  Hepatitis C blood test.  Hepatitis B blood test.  Sexually transmitted disease (STD) testing.  Diabetes screening. This is done by checking your blood sugar (glucose) after you have not eaten for a while (fasting). You may have this done every 1-3 years.  Mammogram. This may be done every 1-2 years. Talk to your health care provider about when you should start having regular mammograms. This may depend on whether you have a family history of breast cancer.  BRCA-related cancer screening. This may be done if you have a family history of breast, ovarian, tubal, or peritoneal cancers.  Pelvic exam and Pap test. This may be done every 3 years starting at age 21. Starting at age 30, this may be done every 5 years if you have a Pap test in combination with an HPV test.  Bone density scan. This is done to screen for osteoporosis. You may have this scan if you are at high risk for osteoporosis. Discuss your test results, treatment options,   and if necessary, the need for more tests with your health care provider. Vaccines Your health care provider may recommend certain vaccines, such as:  Influenza vaccine. This is recommended every year.  Tetanus, diphtheria, and acellular pertussis (Tdap, Td) vaccine. You may need a Td booster every 10 years.  Varicella  vaccine. You may need this if you have not been vaccinated.  Zoster vaccine. You may need this after age 38.  Measles, mumps, and rubella (MMR) vaccine. You may need at least one dose of MMR if you were born in 1957 or later. You may also need a second dose.  Pneumococcal 13-valent conjugate (PCV13) vaccine. You may need this if you have certain conditions and were not previously vaccinated.  Pneumococcal polysaccharide (PPSV23) vaccine. You may need one or two doses if you smoke cigarettes or if you have certain conditions.  Meningococcal vaccine. You may need this if you have certain conditions.  Hepatitis A vaccine. You may need this if you have certain conditions or if you travel or work in places where you may be exposed to hepatitis A.  Hepatitis B vaccine. You may need this if you have certain conditions or if you travel or work in places where you may be exposed to hepatitis B.  Haemophilus influenzae type b (Hib) vaccine. You may need this if you have certain conditions. Talk to your health care provider about which screenings and vaccines you need and how often you need them. This information is not intended to replace advice given to you by your health care provider. Make sure you discuss any questions you have with your health care provider. Document Released: 09/05/2015 Document Revised: 09/29/2017 Document Reviewed: 06/10/2015 Elsevier Interactive Patient Education  2019 [employer].

## 2018-10-16 NOTE — Progress Notes (Signed)
Subjective:    Patient ID: Jessica Bradford, female    DOB: June 02, 1966, 53 y.o.   MRN: [id number]  No chief complaint on file.   HPI Patient is in today for annual preventative exam and follow up on bronchitis.  She reports a rough year with trouble with respiratory symptoms off and on but mostly on since October.  She has been treated multiple times for acute bronchitis and her allergy and continues to struggle with some congestion and cough.  She does note it is remarkably better on Xyzal versus Zyrtec and she is pleased about that.  She is continued to cetirizine.  He is questioning if a referral to an allergist might not be helpful as she still struggles with postnasal drip and congestion.  No fevers or chills.  She is having some movement on her house and has noted as a result she has had some increased need for her albuterol inhaler using it twice this morning.  She follows with a gynecologist for her GYN care and offers no concerns in this regard.  No recent hospitalization.  She is managing her activities of daily living well.  Generally tries to exercise although the bronchitis has interrupted her exercise routine.  Tries to maintain a heart healthy diet. Denies CP/palp/SOB/HA/congestion/fevers/GI or GU c/o. Taking meds as prescribed  Past Medical History:  Diagnosis Date  . Anemia   . Anxiety   . Heart palpitations   . History of chicken pox   . Hyperlipidemia   . Incomplete right bundle branch block   . Mitral valve prolapse   . Urinary incontinence     Past Surgical History:  Procedure Laterality Date  . CHOLECYSTECTOMY    . TUBAL LIGATION  1996  . TUBAL LIGATION      Family History  Problem Relation Age of Onset  . Stroke Father   . Hypertension Father   . Alcohol abuse Father   . Heart attack Mother   . Alcohol abuse Sister   . Alcohol abuse Brother   . Drug abuse Brother        Heroin  . Heart attack Brother   . Alcohol abuse Brother   . Drug abuse Brother         THC  . Heart disease Maternal Grandmother   . Heart disease Maternal Grandfather   . Hypertension Paternal Grandmother   . Dementia Paternal Grandmother   . Cancer Paternal Grandfather 2       lung, smoker  . Other Paternal Grandfather        noncancerous tumor    Social History   Socioeconomic History  . Marital status: Married    Spouse name: Not on file  . Number of children: Not on file  . Years of education: Not on file  . Highest education level: Not on file  Occupational History  . Not on file  Social Needs  . Financial resource strain: Not on file  . Food insecurity:    Worry: Not on file    Inability: Not on file  . Transportation needs:    Medical: Not on file    Non-medical: Not on file  Tobacco Use  . Smoking status: Never Smoker  . Smokeless tobacco: Never Used  Substance and Sexual Activity  . Alcohol use: Yes    Alcohol/week: 2.0 standard drinks    Types: 2 Cans of beer per week  . Drug use: No  . Sexual activity: Yes    Birth  control/protection: Surgical    Comment: Tubal  Lifestyle  . Physical activity:    Days per week: Not on file    Minutes per session: Not on file  . Stress: Not on file  Relationships  . Social connections:    Talks on phone: Not on file    Gets together: Not on file    Attends religious service: Not on file    Active member of club or organization: Not on file    Attends meetings of clubs or organizations: Not on file    Relationship status: Not on file  . Intimate partner violence:    Fear of current or ex partner: Not on file    Emotionally abused: Not on file    Physically abused: Not on file    Forced sexual activity: Not on file  Other Topics Concern  . Not on file  Social History Narrative   Works for [employer], no major dietary restrictions, lives with husband and their dog    Outpatient Medications Prior to Visit  Medication Sig Dispense Refill  . ALPRAZolam (XANAX) 0.25 MG tablet Take 0.5 tablets by  mouth daily as needed.  1  . aspirin 81 MG tablet Take 81 mg by mouth daily.      Marland Kitchen atorvastatin (LIPITOR) 80 MG tablet Take 1 tablet (80 mg total) by mouth daily. 90 tablet 3  . [facility] ACID CONTROLLER MAX ST 20 MG tablet TAKE 1 TABLET BY MOUTH TWICE A DAY **OTC NOT COVERERED BUT INS PERFERED 50 tablet 2  . montelukast (SINGULAIR) 10 MG tablet Take 1 tablet (10 mg total) by mouth at bedtime. 30 tablet 3  . multivitamin (THERAGRAN) per tablet Take 1 tablet by mouth daily.      Marland Kitchen oxybutynin (DITROPAN XL) 15 MG 24 hr tablet Take 1 tablet by mouth daily.  3  . PROAIR HFA 108 (90 Base) MCG/ACT inhaler TAKE 2 PUFFS BY MOUTH EVERY 6 HOURS AS NEEDED FOR WHEEZE OR SHORTNESS OF BREATH 8.5 Inhaler 2  . sertraline (ZOLOFT) 50 MG tablet Take 1 tablet by mouth daily.  2  . azithromycin (ZITHROMAX) 250 MG tablet Take 2 tablets by mouth on day 1, followed by 1 tablet by mouth daily for 4 days. 6 tablet 0  . benzonatate (TESSALON) 100 MG capsule Take 1 capsule (100 mg total) by mouth 3 (three) times daily as needed for cough. 30 capsule 0  . fluticasone (FLONASE) 50 MCG/ACT nasal spray Place 2 sprays into both nostrils daily. 16 g 1  . HYDROcodone-homatropine (HYCODAN) 5-1.5 MG/5ML syrup Take 5 mLs by mouth every 6 (six) hours as needed for cough. 100 mL 0  . lidocaine (XYLOCAINE) 5 % ointment Apply 1 application topically as needed. 35.44 g 0  . oseltamivir (TAMIFLU) 75 MG capsule Take 1 capsule (75 mg total) by mouth 2 (two) times daily. 10 capsule 0  . predniSONE (STERAPRED UNI-PAK 21 TAB) 10 MG (21) TBPK tablet Taper over 6 days 21 tablet 0  . tiZANidine (ZANAFLEX) 4 MG tablet Take 1 tablet (4 mg total) by mouth every 6 (six) hours as needed for muscle spasms. 30 tablet 2   No facility-administered medications prior to visit.     Allergies  Allergen Reactions  . Sulfa Antibiotics Rash  . Doxycycline Rash  . Sulfur Rash    Review of Systems  Constitutional: Positive for malaise/fatigue. Negative for  chills and fever.  HENT: Positive for congestion. Negative for hearing loss.   Eyes: Negative for  discharge.  Respiratory: Positive for cough and sputum production. Negative for shortness of breath.   Cardiovascular: Negative for chest pain, palpitations and leg swelling.  Gastrointestinal: Negative for abdominal pain, blood in stool, constipation, diarrhea, heartburn, nausea and vomiting.  Genitourinary: Negative for dysuria, frequency, hematuria and urgency.  Musculoskeletal: Negative for back pain, falls and myalgias.  Skin: Negative for rash.  Neurological: Negative for dizziness, sensory change, loss of consciousness, weakness and headaches.  Endo/Heme/Allergies: Negative for environmental allergies. Does not bruise/bleed easily.  Psychiatric/Behavioral: Negative for depression and suicidal ideas. The patient is not nervous/anxious and does not have insomnia.        Objective:    Physical Exam Constitutional:      General: She is not in acute distress.    Appearance: She is well-developed.  HENT:     Head: Normocephalic and atraumatic.  Eyes:     Conjunctiva/sclera: Conjunctivae normal.  Neck:     Musculoskeletal: Neck supple.     Thyroid: No thyromegaly.  Cardiovascular:     Rate and Rhythm: Normal rate and regular rhythm.     Heart sounds: Normal heart sounds. No murmur.  Pulmonary:     Effort: Pulmonary effort is normal. No respiratory distress.     Breath sounds: Normal breath sounds.  Abdominal:     General: Bowel sounds are normal. There is no distension.     Palpations: Abdomen is soft. There is no mass.     Tenderness: There is no abdominal tenderness.  Lymphadenopathy:     Cervical: No cervical adenopathy.  Skin:    General: Skin is warm and dry.  Neurological:     Mental Status: She is alert and oriented to person, place, and time.  Psychiatric:        Behavior: Behavior normal.     BP 108/86   Pulse 67   Temp 98.3 F (36.8 C) (Oral)   Resp 18   Ht  5\' 6"  (1.676 m)   Wt 219 lb (99.3 kg)   SpO2 (!) 9%   BMI 35.35 kg/m  Wt Readings from Last 3 Encounters:  10/16/18 219 lb (99.3 kg)  08/28/18 216 lb 9.6 oz (98.2 kg)  08/10/18 214 lb 8 oz (97.3 kg)     Lab Results  Component Value Date   GLUCOSE 95 02/16/2018   CHOL 146 02/16/2018   TRIG 77 02/16/2018   HDL 57 02/16/2018   LDLCALC 74 02/16/2018   ALT 27 02/16/2018   AST 27 02/16/2018   NA 139 02/16/2018   K 4.9 02/16/2018   CL 101 02/16/2018   CREATININE 0.94 02/16/2018   BUN 18 02/16/2018   CO2 26 02/16/2018   TSH 1.71 10/14/2017    Lab Results  Component Value Date   TSH 1.71 10/14/2017   No results found for: WBC, HGB, HCT, MCV, PLT Lab Results  Component Value Date   NA 139 02/16/2018   K 4.9 02/16/2018   CO2 26 02/16/2018   GLUCOSE 95 02/16/2018   BUN 18 02/16/2018   CREATININE 0.94 02/16/2018   BILITOT 0.6 02/16/2018   ALKPHOS 70 02/16/2018   AST 27 02/16/2018   ALT 27 02/16/2018   PROT 6.3 02/16/2018   ALBUMIN 4.1 02/16/2018   CALCIUM 9.5 02/16/2018   GFR 68.68 10/14/2017   Lab Results  Component Value Date   CHOL 146 02/16/2018   Lab Results  Component Value Date   HDL 57 02/16/2018   Lab Results  Component Value Date  LDLCALC 74 02/16/2018   Lab Results  Component Value Date   TRIG 77 02/16/2018   Lab Results  Component Value Date   CHOLHDL 2.6 02/16/2018   No results found for: HGBA1C     Assessment & Plan:   Problem List Items Addressed This Visit    MVP (mitral valve prolapse)    Follows with Dr Melburn Popper      Hyperlipidemia    Tolerating statin, encouraged heart healthy diet, avoid trans fats, minimize simple carbs and saturated fats. Increase exercise as tolerated      Relevant Orders   Lipid panel   Elevated serum creatinine   Obesity    Encouraged DASH diet, decrease po intake and increase exercise as tolerated. Needs 7-8 hours of sleep nightly. Avoid trans fats, eat small, frequent meals every 4-5 hours with  lean proteins, complex carbs and healthy fats. Minimize simple carbs      Anemia    leafy greens, consider increased lean red meat and using cast iron cookware. Continue to monitor, report any concerns      Preventative health care   Relevant Orders   CBC with Differential/Platelet   TSH   T4, free   Comprehensive metabolic panel   Hemorrhoids    Internal and external hemorrhoids and very symptomatic at times. Consider hemorrhoidectomy. Continue Benefiber, hydrate, stool softener.       Bronchitis    Improving but frustrated with persistence since October. She is referred to allergist.       Anxiety    Doing well on Sertraline       Other Visit Diagnoses    Allergic state, subsequent encounter    -  Primary   Relevant Orders   Ambulatory referral to Allergy   Other problems related to lifestyle       Relevant Orders   Hepatitis C antibody      I have discontinued Katori L. Follansbee's tiZANidine, lidocaine, oseltamivir, fluticasone, benzonatate, azithromycin, HYDROcodone-homatropine, and predniSONE. I am also having her maintain her multivitamin, aspirin, sertraline, oxybutynin, ALPRAZolam, atorvastatin, montelukast, PROAIR HFA, and [facility] ACID CONTROLLER MAX ST.  No orders of the defined types were placed in this encounter.    Danise Edge, MD

## 2018-10-16 NOTE — Assessment & Plan Note (Signed)
Tolerating statin, encouraged heart healthy diet, avoid trans fats, minimize simple carbs and saturated fats. Increase exercise as tolerated 

## 2018-10-16 NOTE — Telephone Encounter (Signed)
Received Medical records from [facility]; forwarded to provider/SLS 02/24

## 2018-10-16 NOTE — Assessment & Plan Note (Signed)
leafy greens, consider increased lean red meat and using cast iron cookware. Continue to monitor, report any concerns

## 2018-10-16 NOTE — Assessment & Plan Note (Signed)
Internal and external hemorrhoids and very symptomatic at times. Consider hemorrhoidectomy. Continue Benefiber, hydrate, stool softener.

## 2018-10-17 ENCOUNTER — Encounter: Payer: Self-pay | Admitting: Family Medicine

## 2018-10-17 ENCOUNTER — Telehealth: Payer: Self-pay | Admitting: *Deleted

## 2018-10-17 LAB — HEPATITIS C ANTIBODY
Hepatitis C Ab: NONREACTIVE
SIGNAL TO CUT-OFF: 0.02 (ref <1.00)

## 2018-10-17 NOTE — Telephone Encounter (Signed)
Received Medical records from [facility]; forwarded to provider/SLS 02/25

## 2018-10-19 ENCOUNTER — Telehealth: Payer: Self-pay | Admitting: *Deleted

## 2018-10-19 NOTE — Telephone Encounter (Signed)
Received Medical records from [facility]; forwarded to provider/SLS 02/27

## 2018-10-24 ENCOUNTER — Ambulatory Visit: Payer: BLUE CROSS/BLUE SHIELD | Admitting: Allergy and Immunology

## 2018-10-24 ENCOUNTER — Encounter: Payer: Self-pay | Admitting: Allergy and Immunology

## 2018-10-24 VITALS — BP 116/70 | HR 74 | Temp 98.3°F | Resp 17 | Ht 67.0 in | Wt 214.5 lb

## 2018-10-24 DIAGNOSIS — H1013 Acute atopic conjunctivitis, bilateral: Secondary | ICD-10-CM

## 2018-10-24 DIAGNOSIS — R05 Cough: Secondary | ICD-10-CM

## 2018-10-24 DIAGNOSIS — J4520 Mild intermittent asthma, uncomplicated: Secondary | ICD-10-CM | POA: Diagnosis not present

## 2018-10-24 DIAGNOSIS — J4540 Moderate persistent asthma, uncomplicated: Secondary | ICD-10-CM

## 2018-10-24 DIAGNOSIS — H1010 Acute atopic conjunctivitis, unspecified eye: Secondary | ICD-10-CM | POA: Insufficient documentation

## 2018-10-24 DIAGNOSIS — R059 Cough, unspecified: Secondary | ICD-10-CM | POA: Insufficient documentation

## 2018-10-24 DIAGNOSIS — J3089 Other allergic rhinitis: Secondary | ICD-10-CM | POA: Diagnosis not present

## 2018-10-24 DIAGNOSIS — R053 Chronic cough: Secondary | ICD-10-CM

## 2018-10-24 MED ORDER — HYDROCOD POLST-CPM POLST ER 10-8 MG/5ML PO SUER: 5.0000 mL | Freq: Two times a day (BID) | ORAL | 0 refills | Status: AC | PRN

## 2018-10-24 MED ORDER — FLUTICASONE PROPIONATE 93 MCG/ACT NA EXHU: 2.0000 | INHALANT_SUSPENSION | Freq: Two times a day (BID) | NASAL | 5 refills | Status: DC

## 2018-10-24 MED ORDER — BUDESONIDE-FORMOTEROL FUMARATE 160-4.5 MCG/ACT IN AERO: 2.0000 | INHALATION_SPRAY | Freq: Two times a day (BID) | RESPIRATORY_TRACT | 5 refills | Status: DC

## 2018-10-24 MED ORDER — OLOPATADINE HCL 0.7 % OP SOLN: 1.0000 [drp] | Freq: Every day | OPHTHALMIC | 5 refills | Status: DC | PRN

## 2018-10-24 MED ORDER — CARBINOXAMINE MALEATE 4 MG PO TABS: 4.0000 mg | ORAL_TABLET | Freq: Four times a day (QID) | ORAL | 5 refills | Status: DC | PRN

## 2018-10-24 NOTE — Patient Instructions (Addendum)
Cough, persistent The most common causes of chronic cough include the following: upper airway cough syndrome (UACS) which is caused by variety of rhinosinus conditions; asthma; gastroesophageal reflux disease (GERD); chronic bronchitis from cigarette smoking or other inhaled environmental irritants; non-asthmatic eosinophilic bronchitis; and bronchiectasis. In prospective studies, these conditions have accounted for up to 94% of the causes of chronic cough in immunocompetent adults. The history and physical examination suggest that her cough is multifactorial with contribution from postnasal drainage and bronchial hyperresponsiveness. We will address these issues at this time.   A prescription has been provided for a flutter valve to be used as needed to break the coughing cycle.  Prednisone has been provided, 40 mg x3 days, 20 mg x1 day, 10 mg x1 day, then stop.  A prescription has been provided for Tussionex ER suspension, 5 mL every 12 hours over the next 3 days, then stop.  Treatment plan as outlined below.    We will regroup in 6 weeks to assess treatment response and adjust therapy accordingly.  Perennial allergic rhinitis  Aeroallergen avoidance measures have been discussed and provided in written form.  A prescription has been provided for RyVent (carbinoxamine maleate) 6mg  every 6-8 hours as needed.  A prescription has been provided for [facility], 2 actuations per nostril twice a day. Proper technique has been discussed and demonstrated.  Nasal saline lavage (NeilMed) has been recommended as needed and prior to medicated nasal sprays along with instructions for proper administration.  For thick post nasal drainage, add guaifenesin 1200 mg (Mucinex Maximum Strength)  twice daily as needed with adequate hydration as discussed.  Moderate persistent asthma  A prescription has been provided for Symbicort (budesonide/formoterol) 160/4.5 g,  2 inhalations twice a day. To maximize pulmonary  deposition, a spacer has been provided along with instructions for its proper administration with an HFA inhaler.  For now, continue montelukast 10 mg daily at bedtime and albuterol HFA, 1 to 2 inhalations every 4-6 hours if needed.  Subjective and objective measures of pulmonary function will be followed and the treatment plan will be adjusted accordingly.  Allergic conjunctivitis  Treatment plan as outlined above for allergic rhinitis.  A prescription has been provided for Pazeo, one drop per eye daily as needed.   Return in about 6 weeks (around 12/05/2018), or if symptoms worsen or fail to improve.  Control of Dog or Cat Allergen  Avoidance is the best way to manage a dog or cat allergy. If you have a dog or cat and are allergic to dog or cats, consider removing the dog or cat from the home. If you have a dog or cat but don't want to find it a new home, or if your family wants a pet even though someone in the household is allergic, here are some strategies that may help keep symptoms at bay:  1. Keep the pet out of your bedroom and restrict it to only a few rooms. Be advised that keeping the dog or cat in only one room will not limit the allergens to that room. 2. Don't pet, hug or kiss the dog or cat; if you do, wash your hands with soap and water. 3. High-efficiency particulate air (HEPA) cleaners run continuously in a bedroom or living room can reduce allergen levels over time. 4. Place electrostatic material sheet in the air inlet vent in the bedroom. 5. Regular use of a high-efficiency vacuum cleaner or a central vacuum can reduce allergen levels. 6. Giving your dog or cat  a bath at least once a week can reduce airborne allergen.  Control of Mold Allergen  Mold and fungi can grow on a variety of surfaces provided certain temperature and moisture conditions exist.  Outdoor molds grow on plants, decaying vegetation and soil.  The major outdoor mold, Alternaria and Cladosporium, are  found in very high numbers during hot and dry conditions.  Generally, a late Summer - Fall peak is seen for common outdoor fungal spores.  Rain will temporarily lower outdoor mold spore count, but counts rise rapidly when the rainy period ends.  The most important indoor molds are Aspergillus and Penicillium.  Dark, humid and poorly ventilated basements are ideal sites for mold growth.  The next most common sites of mold growth are the bathroom and the kitchen.  Outdoor [employer] 1. Use air conditioning and keep windows closed 2. Avoid exposure to decaying vegetation. 3. Avoid leaf raking. 4. Avoid grain handling. 5. Consider wearing a face mask if working in moldy areas.  Indoor Mold Control 1. Maintain humidity below 50%. 2. Clean washable surfaces with 5% bleach solution. 3. Remove sources e.g. Contaminated carpets.

## 2018-10-24 NOTE — Assessment & Plan Note (Signed)
   Aeroallergen avoidance measures have been discussed and provided in written form.  A prescription has been provided for RyVent (carbinoxamine maleate) 6mg  every 6-8 hours as needed.  A prescription has been provided for [facility], 2 actuations per nostril twice a day. Proper technique has been discussed and demonstrated.  Nasal saline lavage (NeilMed) has been recommended as needed and prior to medicated nasal sprays along with instructions for proper administration.  For thick post nasal drainage, add guaifenesin 1200 mg (Mucinex Maximum Strength)  twice daily as needed with adequate hydration as discussed.

## 2018-10-24 NOTE — Assessment & Plan Note (Signed)
   A prescription has been provided for Symbicort (budesonide/formoterol) 160/4.5 g, 2 inhalations twice a day. To maximize pulmonary deposition, a spacer has been provided along with instructions for its proper administration with an HFA inhaler.  For now, continue montelukast 10 mg daily at bedtime and albuterol HFA, 1 to 2 inhalations every 4-6 hours if needed.  Subjective and objective measures of pulmonary function will be followed and the treatment plan will be adjusted accordingly.

## 2018-10-24 NOTE — Assessment & Plan Note (Signed)
The most common causes of chronic cough include the following: upper airway cough syndrome (UACS) which is caused by variety of rhinosinus conditions; asthma; gastroesophageal reflux disease (GERD); chronic bronchitis from cigarette smoking or other inhaled environmental irritants; non-asthmatic eosinophilic bronchitis; and bronchiectasis. In prospective studies, these conditions have accounted for up to 94% of the causes of chronic cough in immunocompetent adults. The history and physical examination suggest that her cough is multifactorial with contribution from postnasal drainage and bronchial hyperresponsiveness. We will address these issues at this time.   A prescription has been provided for a flutter valve to be used as needed to break the coughing cycle.  Prednisone has been provided, 40 mg x3 days, 20 mg x1 day, 10 mg x1 day, then stop.  A prescription has been provided for Tussionex ER suspension, 5 mL every 12 hours over the next 3 days, then stop.  Treatment plan as outlined below.    We will regroup in 6 weeks to assess treatment response and adjust therapy accordingly.

## 2018-10-24 NOTE — Assessment & Plan Note (Signed)
   Treatment plan as outlined above for allergic rhinitis.  A prescription has been provided for Pazeo, one drop per eye daily as needed. 

## 2018-10-24 NOTE — Progress Notes (Signed)
New Patient Note  RE: Jessica Bradford MRN: [id number] DOB: December 03, 1965 Date of Office Visit: 10/24/2018  Referring provider: Bradd CanaryBlyth, Stacey A, MD Primary care provider: Bradd CanaryBlyth, Stacey A, MD  Chief Complaint: Cough; Sinus Problem; and Breathing Problem   History of present illness: Jessica Bradford is a 53 y.o. female seen today in consultation requested by Danise EdgeStacey Blyth, MD.  She complains of "coughing on and off since October."  She reports that she had bronchitis in July and in October.  In addition, she had flulike symptoms in December but flu test was negative.  However, those symptoms "went right into bronchitis."  She experiences coughing, shortness of breath, and occasional wheezing.  She is unable to determine if the wheezes from the lungs or the throat.  She was started on omeprazole without discernible benefit.  She was also started on levocetirizine and montelukast with mild improvement.  While on the levocetirizine and montelukast she has been able to sleep through the night.  She had chest x-rays in December 2019 and January 2020, neither revealing active cardiopulmonary disease.  She complains of copious thick postnasal drainage, nasal congestion, sneezing, ocular pruritus, sneezing, maxillary sinus pressure, occasional ear pressure.  These symptoms occur year-round, however the sneezing and ocular pruritus are worse during the fall and springtime.  She states that she had never had nasal allergy symptoms prior to these past few years.  Before starting levocetirizine and montelukast she had been attempting to control the allergy symptoms with loratadine and/or diphenhydramine.  Assessment and plan: Cough, persistent The most common causes of chronic cough include the following: upper airway cough syndrome (UACS) which is caused by variety of rhinosinus conditions; asthma; gastroesophageal reflux disease (GERD); chronic bronchitis from cigarette smoking or other inhaled environmental  irritants; non-asthmatic eosinophilic bronchitis; and bronchiectasis. In prospective studies, these conditions have accounted for up to 94% of the causes of chronic cough in immunocompetent adults. The history and physical examination suggest that her cough is multifactorial with contribution from postnasal drainage and bronchial hyperresponsiveness. We will address these issues at this time.   A prescription has been provided for a flutter valve to be used as needed to break the coughing cycle.  Prednisone has been provided, 40 mg x3 days, 20 mg x1 day, 10 mg x1 day, then stop.  A prescription has been provided for Tussionex ER suspension, 5 mL every 12 hours over the next 3 days, then stop.  Treatment plan as outlined below.    We will regroup in 6 weeks to assess treatment response and adjust therapy accordingly.  Perennial allergic rhinitis  Aeroallergen avoidance measures have been discussed and provided in written form.  A prescription has been provided for RyVent (carbinoxamine maleate) 6mg  every 6-8 hours as needed.  [facility], 2 actuations per nostril twice a day. Proper technique has been discussed and demonstrated.  Nasal saline lavage (NeilMed) has been recommended as needed and prior to medicated nasal sprays along with instructions for proper administration.  For thick post nasal drainage, add guaifenesin 1200 mg (Mucinex Maximum Strength)  twice daily as needed with adequate hydration as discussed.  Moderate persistent asthma  A prescription has been provided for Symbicort (budesonide/formoterol) 160/4.5 g,  2 inhalations twice a day. To maximize pulmonary deposition, a spacer has been provided along with instructions for its proper administration with an HFA inhaler.  For now, continue montelukast 10 mg daily at bedtime and albuterol HFA, 1 to 2 inhalations every  4-6 hours if needed.  Subjective and objective measures of pulmonary  function will be followed and the treatment plan will be adjusted accordingly.  Allergic conjunctivitis  Treatment plan as outlined above for allergic rhinitis.  A prescription has been provided for Pazeo, one drop per eye daily as needed.   Meds ordered this encounter  Medications  . chlorpheniramine-HYDROcodone (TUSSIONEX PENNKINETIC ER) 10-8 MG/5ML SUER    Sig: Take 5 mLs by mouth every 12 (twelve) hours as needed for up to 3 days for cough.    Dispense:  30 mL    Refill:  0  . Fluticasone Propionate (XHANCE) 93 MCG/ACT EXHU    Sig: Place 2 sprays into the nose 2 (two) times daily.    Dispense:  32 mL    Refill:  5    [phone] (M)  . budesonide-formoterol (SYMBICORT) 160-4.5 MCG/ACT inhaler    Sig: Inhale 2 puffs into the lungs 2 (two) times daily.    Dispense:  1 Inhaler    Refill:  5  . Olopatadine HCl (PAZEO) 0.7 % SOLN    Sig: Place 1 drop into both eyes daily as needed.    Dispense:  1 Bottle    Refill:  5  . Carbinoxamine Maleate 4 MG TABS    Sig: Take 1 tablet (4 mg total) by mouth every 6 (six) hours as needed.    Dispense:  28 each    Refill:  5    Diagnostics: Spirometry: Spirometry reveals an FVC of 2.40 L and an FEV1 of 1.85 L (63% predicted) with 120 mL postbronchodilator improvement.  Please see scanned spirometry results for details. Epicutaneous testing: Positive to cat hair and mold. Intradermal testing: Positive to dog epithelia.   Physical examination: Blood pressure 116/70, pulse 74, temperature 98.3 F (36.8 C), temperature source Oral, resp. rate 17, height 5\' 7"  (1.702 m), weight 214 lb 8 oz (97.3 kg), SpO2 95 %.  General: Alert, interactive, in no acute distress. HEENT: TMs pearly gray, turbinates edematous with thick discharge, post-pharynx erythematous with cobblestoning.  Neck: Supple without lymphadenopathy. Lungs: Clear to auscultation without wheezing, rhonchi or rales. CV: Normal S1, S2 without murmurs. Abdomen: Nondistended,  nontender. Skin: Warm and dry, without lesions or rashes. Extremities:  No clubbing, cyanosis or edema. Neuro:   Grossly intact.   Review of systems:  Review of systems negative except as noted in HPI / PMHx or noted below: Review of Systems  Constitutional: Negative.   HENT: Negative.   Eyes: Negative.   Respiratory: Negative.   Cardiovascular: Negative.   Gastrointestinal: Negative.   Genitourinary: Negative.   Musculoskeletal: Negative.   Skin: Negative.   Neurological: Negative.   Endo/Heme/Allergies: Negative.   Psychiatric/Behavioral: Negative.     Past medical history:  Past Medical History:  Diagnosis Date  . Anemia   . Anxiety   . Heart palpitations   . History of chicken pox   . Hyperlipidemia   . Incomplete right bundle branch block   . Mitral valve prolapse   . Urinary incontinence     Past surgical history:  Past Surgical History:  Procedure Laterality Date  . CHOLECYSTECTOMY    . TUBAL LIGATION  1996  . TUBAL LIGATION      Family history: Family History  Problem Relation Age of Onset  . Stroke Father   . Hypertension Father   . Alcohol abuse Father   . Heart attack Mother   . Alcohol abuse Sister   . Alcohol abuse Brother   .  Drug abuse Brother        Heroin  . Heart attack Brother   . Alcohol abuse Brother   . Drug abuse Brother        THC  . Heart disease Maternal Grandmother   . Heart disease Maternal Grandfather   . Hypertension Paternal Grandmother   . Dementia Paternal Grandmother   . Cancer Paternal Grandfather 77       lung, smoker  . Other Paternal Grandfather        noncancerous tumor    Social history: Social History   Socioeconomic History  . Marital status: Married    Spouse name: Not on file  . Number of children: Not on file  . Years of education: Not on file  . Highest education level: Not on file  Occupational History  . Not on file  Social Needs  . Financial resource strain: Not on file  . Food  insecurity:    Worry: Not on file    Inability: Not on file  . Transportation needs:    Medical: Not on file    Non-medical: Not on file  Tobacco Use  . Smoking status: Never Smoker  . Smokeless tobacco: Never Used  Substance and Sexual Activity  . Alcohol use: Yes    Alcohol/week: 2.0 standard drinks    Types: 2 Cans of beer per week  . Drug use: No  . Sexual activity: Yes    Birth control/protection: Surgical    Comment: Tubal  Lifestyle  . Physical activity:    Days per week: Not on file    Minutes per session: Not on file  . Stress: Not on file  Relationships  . Social connections:    Talks on phone: Not on file    Gets together: Not on file    Attends religious service: Not on file    Active member of club or organization: Not on file    Attends meetings of clubs or organizations: Not on file    Relationship status: Not on file  . Intimate partner violence:    Fear of current or ex partner: Not on file    Emotionally abused: Not on file    Physically abused: Not on file    Forced sexual activity: Not on file  Other Topics Concern  . Not on file  Social History Narrative   Works for [employer], no major dietary restrictions, lives with husband and their dog   Environmental History: The patient lives in a 55 year old house with carpeting throughout, gas heat, and central air.  There is no known mold/water damage in the home.  There are no pets in the home.  She is a non-smoker.  Allergies as of 10/24/2018      Reactions   Sulfa Antibiotics Rash   Doxycycline Rash   Sulfur Rash      Medication List       Accurate as of October 24, 2018 12:13 PM. Always use your most recent med list.        ALPRAZolam 0.25 MG tablet Commonly known as:  XANAX Take 0.5 tablets by mouth daily as needed.   aspirin 81 MG tablet Take 81 mg by mouth daily.   atorvastatin 80 MG tablet Commonly known as:  LIPITOR Take 1 tablet (80 mg total) by mouth daily.   budesonide-formoterol  160-4.5 MCG/ACT inhaler Commonly known as:  SYMBICORT Inhale 2 puffs into the lungs 2 (two) times daily.   Carbinoxamine Maleate 4 MG Tabs  Take 1 tablet (4 mg total) by mouth every 6 (six) hours as needed.   chlorpheniramine-HYDROcodone 10-8 MG/5ML Suer Commonly known as:  TUSSIONEX PENNKINETIC ER Take 5 mLs by mouth every 12 (twelve) hours as needed for up to 3 days for cough.   [employer] ACID CONTROLLER MAX ST 20 MG tablet Generic drug:  famotidine TAKE 1 TABLET BY MOUTH TWICE A DAY **OTC NOT COVERERED BUT INS PERFERED   Fluticasone Propionate 93 MCG/ACT Exhu Commonly known as:  XHANCE Place 2 sprays into the nose 2 (two) times daily.   levocetirizine 5 MG tablet Commonly known as:  XYZAL Take 5 mg by mouth every evening.   montelukast 10 MG tablet Commonly known as:  SINGULAIR Take 1 tablet (10 mg total) by mouth at bedtime.   multivitamin per tablet Take 1 tablet by mouth daily.   Olopatadine HCl 0.7 % Soln Commonly known as:  PAZEO Place 1 drop into both eyes daily as needed.   oxybutynin 15 MG 24 hr tablet Commonly known as:  DITROPAN XL Take 1 tablet by mouth daily.   PROAIR HFA 108 (90 Base) MCG/ACT inhaler Generic drug:  albuterol TAKE 2 PUFFS BY MOUTH EVERY 6 HOURS AS NEEDED FOR WHEEZE OR SHORTNESS OF BREATH   sertraline 50 MG tablet Commonly known as:  ZOLOFT Take 1 tablet by mouth daily.   valACYclovir 500 MG tablet Commonly known as:  VALTREX       Known medication allergies: Allergies  Allergen Reactions  . Sulfa Antibiotics Rash  . Doxycycline Rash  . Sulfur Rash    I appreciate the opportunity to take part in Jessica Bradford's care. Please do not hesitate to contact me with questions.  Sincerely,   R. Jorene Guest, MD

## 2018-10-30 ENCOUNTER — Telehealth: Payer: Self-pay | Admitting: *Deleted

## 2018-10-30 NOTE — Telephone Encounter (Signed)
Received Medical records from [facility]; forwarded to provider/SLS 03/09

## 2018-10-30 NOTE — Telephone Encounter (Signed)
Received Medical records from [facility]; forwarded to provider/SLS 03/09  

## 2018-11-06 ENCOUNTER — Encounter: Payer: Self-pay | Admitting: Family Medicine

## 2018-12-05 ENCOUNTER — Ambulatory Visit (INDEPENDENT_AMBULATORY_CARE_PROVIDER_SITE_OTHER): Payer: BLUE CROSS/BLUE SHIELD | Admitting: Allergy and Immunology

## 2018-12-05 ENCOUNTER — Encounter: Payer: Self-pay | Admitting: Allergy and Immunology

## 2018-12-05 ENCOUNTER — Other Ambulatory Visit: Payer: Self-pay

## 2018-12-05 DIAGNOSIS — J4540 Moderate persistent asthma, uncomplicated: Secondary | ICD-10-CM | POA: Diagnosis not present

## 2018-12-05 DIAGNOSIS — H1013 Acute atopic conjunctivitis, bilateral: Secondary | ICD-10-CM

## 2018-12-05 DIAGNOSIS — R05 Cough: Secondary | ICD-10-CM | POA: Diagnosis not present

## 2018-12-05 DIAGNOSIS — R053 Chronic cough: Secondary | ICD-10-CM

## 2018-12-05 DIAGNOSIS — J3089 Other allergic rhinitis: Secondary | ICD-10-CM | POA: Diagnosis not present

## 2018-12-05 MED ORDER — MONTELUKAST SODIUM 10 MG PO TABS: 10.0000 mg | ORAL_TABLET | Freq: Every day | ORAL | 5 refills | Status: DC

## 2018-12-05 MED ORDER — BUDESONIDE-FORMOTEROL FUMARATE 160-4.5 MCG/ACT IN AERO: 2.0000 | INHALATION_SPRAY | Freq: Two times a day (BID) | RESPIRATORY_TRACT | 5 refills | Status: DC

## 2018-12-05 NOTE — Assessment & Plan Note (Signed)
Well-controlled, we will stepdown therapy at this time.  A prescription has been provided for Symbicort (budesonide/formoterol) 80/4.5 g, 2 inhalations via spacer device twice a day.  If lower respiratory symptoms progress in frequency and/or severity, the patient is to resume the previous dose of Symbicort 160/4.5 g.  A refill prescription has been provided for montelukast 10 mg daily at bedtime.  Continue albuterol HFA, 1 to 2 inhalations every 4-6 hours if needed.  Subjective and objective measures of pulmonary function will be followed and the treatment plan will be adjusted accordingly.

## 2018-12-05 NOTE — Assessment & Plan Note (Signed)
Improved/resolved.  Most likely secondary to bronchial hyperresponsiveness and postnasal drainage.  Treatment plan as outlined above.

## 2018-12-05 NOTE — Progress Notes (Signed)
Follow-up Telemedicine Note  RE: Jessica Bradford MRN: [id number] DOB: [birth year] Date of Telemedicine Visit: 12/05/2018  Primary care provider: Bradd Canary, MD Referring provider: Bradd Canary, MD  Telemedicine Follow Up Visit via Telephone: I connected with Jessica Bradford for a follow up on 12/05/18 by telephone and verified that I am speaking with the correct person using two identifiers.   The limitations, risks, security and privacy concerns of performing an evaluation and management service by telemedicine, the availability of in person appointments, and that there may be a patient responsible charge related to this service were discussed. The patient expressed understanding and agreed to proceed.  Patient is at home.  Provider is at the office.  Visit start time: 10:58 am Visit end time: 11:24 am Insurance consent/check in by: Victorino Dike Medical consent and medical assistant/nurse: Morrie Sheldon  History of present illness: Jessica Bradford is a 53 y.o. female with persistent asthma, allergic rhinoconjunctivitis, and history of persistent cough presenting today via telephone for follow-up visit.  She was previously seen in this clinic for her initial evaluation on October 24, 2018.  She reports that the persistent cough has resolved.  She states that the cough diminished while on prednisone and completely resolved within 2 weeks of finishing the prednisone course while taking Symbicort 160-4.5 g, 2 inhalations via spacer device twice daily, montelukast 10 mg daily, as well as medications prescribed for allergic rhinosinusitis.  She has been able to discontinue the carbinoxamine and Xhance nasal spray.  She is still taking nasal saline lavage.  She reports that she ran out of montelukast approximately 1 week ago and needs a refill.  She has only experienced mild bouts of coughing when outdoors with prolonged pollen exposure.  She has not experienced wheezing, chest tightness, dyspnea,  limitations in normal daily activities or nocturnal awakenings due to lower respiratory symptoms.  She has no nasal or ocular symptom complaints today.  Assessment and plan: Moderate persistent asthma Well-controlled, we will stepdown therapy at this time.  A prescription has been provided for Symbicort (budesonide/formoterol) 80/4.5 g,  2 inhalations via spacer device twice a day.  If lower respiratory symptoms progress in frequency and/or severity, the patient is to resume the previous dose of Symbicort 160/4.5 g.  A refill prescription has been provided for montelukast 10 mg daily at bedtime.  Continue albuterol HFA, 1 to 2 inhalations every 4-6 hours if needed.  Subjective and objective measures of pulmonary function will be followed and the treatment plan will be adjusted accordingly.  Perennial allergic rhinitis  Continue appropriate allergen avoidance measures and nasal saline irrigation as needed.  If needed, may add Xhance nasal spray, carbinoxamine maleate, and/or guaifenesin (Mucinex).  Cough, persistent Improved/resolved.  Most likely secondary to bronchial hyperresponsiveness and postnasal drainage.  Treatment plan as outlined above.   Meds ordered this encounter  Medications   montelukast (SINGULAIR) 10 MG tablet    Sig: Take 1 tablet (10 mg total) by mouth at bedtime.    Dispense:  30 tablet    Refill:  5   budesonide-formoterol (SYMBICORT) 160-4.5 MCG/ACT inhaler    Sig: Inhale 2 puffs into the lungs 2 (two) times daily.    Dispense:  1 Inhaler    Refill:  5    Diagnostics: None.  Physical examination: Physical Exam Not obtained as encounter was done via telephone.   The following portions of the patient's history were reviewed and updated as appropriate: allergies, current medications, past family history, past medical  history, past social history, past surgical history and problem list.  Allergies as of 12/05/2018      Reactions   Sulfa  Antibiotics Rash   Doxycycline Rash   Sulfur Rash      Medication List       Accurate as of December 05, 2018 12:28 PM. Always use your most recent med list.        ALPRAZolam 0.25 MG tablet Commonly known as:  XANAX Take 0.5 tablets by mouth daily as needed.   aspirin 81 MG tablet Take 81 mg by mouth daily.   atorvastatin 80 MG tablet Commonly known as:  LIPITOR Take 1 tablet (80 mg total) by mouth daily.   budesonide-formoterol 160-4.5 MCG/ACT inhaler Commonly known as:  Symbicort Inhale 2 puffs into the lungs 2 (two) times daily.   Carbinoxamine Maleate 4 MG Tabs Take 1 tablet (4 mg total) by mouth every 6 (six) hours as needed.   [employer] Acid Controller Max St 20 MG tablet Generic drug:  famotidine TAKE 1 TABLET BY MOUTH TWICE A DAY **OTC NOT COVERERED BUT INS PERFERED   Fluticasone Propionate 93 MCG/ACT Exhu Commonly known as:  Xhance Place 2 sprays into the nose 2 (two) times daily.   levocetirizine 5 MG tablet Commonly known as:  XYZAL Take 5 mg by mouth every evening.   montelukast 10 MG tablet Commonly known as:  SINGULAIR Take 1 tablet (10 mg total) by mouth at bedtime.   multivitamin per tablet Take 1 tablet by mouth daily.   Olopatadine HCl 0.7 % Soln Commonly known as:  Pazeo Place 1 drop into both eyes daily as needed.   oxybutynin 15 MG 24 hr tablet Commonly known as:  DITROPAN XL Take 1 tablet by mouth daily.   ProAir HFA 108 (90 Base) MCG/ACT inhaler Generic drug:  albuterol TAKE 2 PUFFS BY MOUTH EVERY 6 HOURS AS NEEDED FOR WHEEZE OR SHORTNESS OF BREATH   sertraline 50 MG tablet Commonly known as:  ZOLOFT Take 1 tablet by mouth daily.   valACYclovir 500 MG tablet Commonly known as:  VALTREX       Allergies  Allergen Reactions   Sulfa Antibiotics Rash   Doxycycline Rash   Sulfur Rash   Review of systems: Review of systems negative except as noted in HPI / PMHx or noted below: Constitutional: Negative.  HENT: Negative.     Eyes: Negative.  Respiratory: Negative.   Cardiovascular: Negative.  Gastrointestinal: Negative.  Genitourinary: Negative.  Musculoskeletal: Negative.  Neurological: Negative.  Endo/Heme/Allergies: Negative.  Cutaneous: Negative.  Past Medical History:  Diagnosis Date   Anemia    Anxiety    Heart palpitations    History of chicken pox    Hyperlipidemia    Incomplete right bundle branch block    Mitral valve prolapse    Urinary incontinence     Family History  Problem Relation Age of Onset   Stroke Father    Hypertension Father    Alcohol abuse Father    Heart attack Mother    Alcohol abuse Sister    Alcohol abuse Brother    Drug abuse Brother        Heroin   Heart attack Brother    Alcohol abuse Brother    Drug abuse Brother        THC   Heart disease Maternal Grandmother    Heart disease Maternal Grandfather    Hypertension Paternal Grandmother    Dementia Paternal Grandmother    Cancer Paternal  Grandfather [id number]       lung, smoker   Other Paternal Grandfather        noncancerous tumor    Social History   Socioeconomic History   Marital status: Married    Spouse name: Not on file   Number of children: Not on file   Years of education: Not on file   Highest education level: Not on file  Occupational History   Not on file  Social Needs   Financial resource strain: Not on file   Food insecurity:    Worry: Not on file    Inability: Not on file   Transportation needs:    Medical: Not on file    Non-medical: Not on file  Tobacco Use   Smoking status: Never Smoker   Smokeless tobacco: Never Used  Substance and Sexual Activity   Alcohol use: Yes    Alcohol/week: 2.0 standard drinks    Types: 2 Cans of beer per week   Drug use: No   Sexual activity: Yes    Birth control/protection: Surgical    Comment: Tubal  Lifestyle   Physical activity:    Days per week: Not on file    Minutes per session: Not on file    Stress: Not on file  Relationships   Social connections:    Talks on phone: Not on file    Gets together: Not on file    Attends religious service: Not on file    Active member of club or organization: Not on file    Attends meetings of clubs or organizations: Not on file    Relationship status: Not on file   Intimate partner violence:    Fear of current or ex partner: Not on file    Emotionally abused: Not on file    Physically abused: Not on file    Forced sexual activity: Not on file  Other Topics Concern   Not on file  Social History Narrative   Works for [employer], no major dietary restrictions, lives with husband and their dog    Previous notes and tests were reviewed.  I discussed the assessment and treatment plan with the patient. The patient was provided an opportunity to ask questions and all were answered. The patient agreed with the plan and demonstrated an understanding of the instructions.   The patient was advised to call back or seek an in-person evaluation if the symptoms worsen or if the condition fails to improve as anticipated.  I provided 26 minutes of non-face-to-face time during this encounter.  I appreciate the opportunity to take part in Jessica Bradford's care. Please do not hesitate to contact me with questions.  Sincerely,   R. Jorene Guestarter Sheletha Bow, MD

## 2018-12-05 NOTE — Patient Instructions (Addendum)
Moderate persistent asthma Well-controlled, we will stepdown therapy at this time.  A prescription has been provided for Symbicort (budesonide/formoterol) 80/4.5 g,  2 inhalations via spacer device twice a day.  If lower respiratory symptoms progress in frequency and/or severity, the patient is to resume the previous dose of Symbicort 160/4.5 g.  A refill prescription has been provided for montelukast 10 mg daily at bedtime.  Continue albuterol HFA, 1 to 2 inhalations every 4-6 hours if needed.  Subjective and objective measures of pulmonary function will be followed and the treatment plan will be adjusted accordingly.  Perennial allergic rhinitis  Continue appropriate allergen avoidance measures and nasal saline irrigation as needed.  If needed, may add Xhance nasal spray, carbinoxamine maleate, and/or guaifenesin (Mucinex).  Cough, persistent Improved/resolved.  Most likely secondary to bronchial hyperresponsiveness and postnasal drainage.  Treatment plan as outlined above.   Return in about 3 months (around 03/06/2019), or if symptoms worsen or fail to improve.

## 2018-12-05 NOTE — Assessment & Plan Note (Signed)
   Continue appropriate allergen avoidance measures and nasal saline irrigation as needed.  If needed, may add Xhance nasal spray, carbinoxamine maleate, and/or guaifenesin (Mucinex). 

## 2018-12-07 ENCOUNTER — Other Ambulatory Visit: Payer: Self-pay | Admitting: *Deleted

## 2018-12-07 MED ORDER — BUDESONIDE-FORMOTEROL FUMARATE 80-4.5 MCG/ACT IN AERO: 2.0000 | INHALATION_SPRAY | Freq: Two times a day (BID) | RESPIRATORY_TRACT | 5 refills | Status: DC

## 2018-12-11 ENCOUNTER — Other Ambulatory Visit: Payer: Self-pay

## 2018-12-11 MED ORDER — MONTELUKAST SODIUM 10 MG PO TABS: 10.0000 mg | ORAL_TABLET | Freq: Every day | ORAL | 5 refills | Status: DC

## 2018-12-15 ENCOUNTER — Other Ambulatory Visit: Payer: Self-pay | Admitting: Family Medicine

## 2018-12-20 ENCOUNTER — Encounter: Payer: Self-pay | Admitting: Family Medicine

## 2018-12-20 DIAGNOSIS — R6882 Decreased libido: Secondary | ICD-10-CM | POA: Diagnosis not present

## 2018-12-20 DIAGNOSIS — Z6835 Body mass index (BMI) 35.0-35.9, adult: Secondary | ICD-10-CM | POA: Diagnosis not present

## 2019-02-19 ENCOUNTER — Other Ambulatory Visit: Payer: Self-pay | Admitting: Allergy and Immunology

## 2019-02-23 ENCOUNTER — Other Ambulatory Visit: Payer: Self-pay | Admitting: Family Medicine

## 2019-02-26 ENCOUNTER — Encounter: Payer: Self-pay | Admitting: Family Medicine

## 2019-02-27 ENCOUNTER — Telehealth: Payer: Self-pay

## 2019-02-27 NOTE — Telephone Encounter (Signed)
Spoke with pt and she answered no to all prescreening questions. Pt is aware of new protocol for in office visit.        COVID-19 Pre-Screening Questions:  . In the past 7 to 10 days have you had a cough,  shortness of breath, headache, congestion, fever (100 or greater) body aches, chills, sore throat, or sudden loss of taste or sense of smell? NO . Have you been around anyone with known Covid 19? NO . Have you been around anyone who is awaiting Covid 19 test results in the past 7 to 10 days? NO . Have you been around anyone who has been exposed to Covid 19, or has mentioned symptoms of Covid 19 within the past 7 to 10 days? NO  If you have any concerns/questions about symptoms patients report during screening (either on the phone or at threshold). Contact the provider seeing the patient or DOD for further guidance.  If neither are available contact a member of the leadership team.

## 2019-02-28 DIAGNOSIS — R6882 Decreased libido: Secondary | ICD-10-CM | POA: Diagnosis not present

## 2019-03-02 ENCOUNTER — Telehealth: Payer: Self-pay | Admitting: Radiology

## 2019-03-02 ENCOUNTER — Ambulatory Visit: Payer: BC Managed Care – PPO | Admitting: Cardiovascular Disease

## 2019-03-02 ENCOUNTER — Encounter: Payer: Self-pay | Admitting: Cardiovascular Disease

## 2019-03-02 ENCOUNTER — Other Ambulatory Visit: Payer: Self-pay

## 2019-03-02 VITALS — BP 112/78 | HR 68 | Ht 67.0 in | Wt 217.8 lb

## 2019-03-02 DIAGNOSIS — R42 Dizziness and giddiness: Secondary | ICD-10-CM

## 2019-03-02 DIAGNOSIS — R55 Syncope and collapse: Secondary | ICD-10-CM | POA: Diagnosis not present

## 2019-03-02 DIAGNOSIS — R011 Cardiac murmur, unspecified: Secondary | ICD-10-CM | POA: Diagnosis not present

## 2019-03-02 DIAGNOSIS — I951 Orthostatic hypotension: Secondary | ICD-10-CM

## 2019-03-02 NOTE — Patient Instructions (Addendum)
Medication Instructions:  Your physician recommends that you continue on your current medications as directed. Please refer to the Current Medication list given to you today.  If you need a refill on your cardiac medications before your next appointment, please call your pharmacy.    Lab work: None Ordered    Testing/Procedures: Your physician has requested that you have an echocardiogram. Echocardiography is a painless test that uses sound waves to create images of your heart. It provides your doctor with information about the size and shape of your heart and how well your heart's chambers and valves are working. This procedure takes approximately one hour. There are no restrictions for this procedure.  Your physician has recommended that you wear an event monitor. Event monitors are medical devices that record the heart's electrical activity. Doctors most often [place] these monitors to diagnose arrhythmias. Arrhythmias are problems with the speed or rhythm of the heartbeat. The monitor is a small, portable device. You can wear one while you do your normal daily activities. This is usually used to diagnose what is causing palpitations/syncope (passing out).    Follow-Up: At [facility], you and your health needs are our priority.  As part of our continuing mission to provide you with exceptional heart care, we have created designated Provider Care Teams.  These Care Teams include your primary Cardiologist (physician) and Advanced Practice Providers (APPs -  Physician Assistants and Nurse Practitioners) who all work together to provide you with the care you need, when you need it. You will need a follow up appointment in:  3 months on Monday Oct. 12 at 8:20 with Dr. Acie Fredrickson.  You may see one of the following Advanced Practice Providers on your designated Care Team in the future: Richardson Dopp, PA-C [employer], PA-C . Daune Perch, NP

## 2019-03-02 NOTE — Progress Notes (Signed)
Cardiology Office Note   Date:  03/02/2019   ID:  Jessica Bradford, DOB 1966/05/05, MRN [id number]  PCP:  Mosie Lukes, MD  Cardiologist:   Mertie Moores, MD   1. Hyperliidemia 2. Dizziness    Previous notes:  Jessica Bradford is a 53 year old female with a history of hypercholesterolemia. She has a very strong family history of coronary artery disease. She's done very well since she last saw Dr. Doreatha Lew last year. She walks 4-6 miles every day. She denies any chest pain or shortness of breath with her walking.  She has a history of mitral valve prolapse. She has occasional palpitations. These typically occur when she is at rest.  She is recovering from an episode of cellulitis. She apparently stepped on a splinter on her deck and developed cellulitis in her foot. She was on antibiotics for several weeks. She is still not exercising because of some persistent foot pain and swelling.  Nov. 19, 2014:  No CP, no dyspnea, Still walking 5+ miles a day. Also doing strength training. She gets a discount from her insurance plan to work out    Dec 23, 2014:  Jessica Bradford is a 53 y.o. female who presents for  Follow up of her hyperlipidemia Walks regularly,  Also does strength training.  Trying to watch her diet.   Jan 07, 2016: Doing well  Has had several episodes of waking up "thinking she was not breathing " Snores some but does not have apneic episodes according to husband  Exercising some .   [place] twice a week , walks the other days  Has had difficulty losing weight .   January 26, 2017:  Jessica Bradford is doing well from a cardiac standpoint Is very fatigued for the past 6 months . Is tired all of the time  Takes naps frequently  Still working out at [employer] - works outs are going ok .   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    Is losing some hair ( which is some concern)    March 02, 2019 :  Jessica Bradford is see today for episodes of dizziness.  Hx of hyperlipidemia  3 times over the past 7 weeks. Near syncope.   Feels fine and then has palpitations,  Legs feel like jello and has near syncope  One episode happened at [facility],  She bought a BP cuff,  BP was 95/64 when she got home Has not occurred since June 27.  Drinks water  All have occurred as she is standing up  From a seated position .  2 episodes at lunch   Breakfast - oatmeal  Or toast, yogart Lunch - salad or sandwich Dinner - frequently does not have meat .    Echo in 2012 was normal.  Has worn a monitor about 10 years ago    Past Medical History:  Diagnosis Date  . Anemia   . Anxiety   . Heart palpitations   . History of chicken pox   . Hyperlipidemia   . Incomplete right bundle branch block   . Mitral valve prolapse   . Urinary incontinence     Past Surgical History:  Procedure Laterality Date  . CHOLECYSTECTOMY    . TUBAL LIGATION  1996  . TUBAL LIGATION       Current Outpatient Medications  Medication Sig Dispense Refill  .  ALPRAZolam (XANAX) 0.25 MG tablet Take 0.5 tablets by mouth daily as needed.  1  . aspirin 81 MG tablet Take 81 mg by mouth daily.      Marland Kitchen. atorvastatin (LIPITOR) 80 MG tablet Take 1 tablet (80 mg total) by mouth daily. 90 tablet 3  . budesonide-formoterol (SYMBICORT) 80-4.5 MCG/ACT inhaler Inhale 2 puffs into the lungs 2 (two) times daily. 1 Inhaler 5  . Carbinoxamine Maleate 4 MG TABS Take 1 tablet (4 mg total) by mouth every 6 (six) hours as needed. 28 each 5  . famotidine (PEPCID) 20 MG tablet TAKE 1 TABLET BY MOUTH TWICE A DAY 50 tablet 2  . levocetirizine (XYZAL) 5 MG tablet Take 5 mg by mouth every evening.    . montelukast (SINGULAIR) 10 MG tablet Take 1 tablet (10 mg total) by mouth at bedtime. 30 tablet 5  . multivitamin (THERAGRAN) per tablet Take 1 tablet by mouth daily.      . Olopatadine HCl (PAZEO) 0.7 % SOLN Place 1 drop into both eyes daily as  needed. 1 Bottle 5  . oxybutynin (DITROPAN XL) 15 MG 24 hr tablet Take 1 tablet by mouth daily.  3  . PROAIR HFA 108 (90 Base) MCG/ACT inhaler TAKE 2 PUFFS BY MOUTH EVERY 6 HOURS AS NEEDED FOR WHEEZE OR SHORTNESS OF BREATH 8.5 Inhaler 2  . sertraline (ZOLOFT) 50 MG tablet Take 1 tablet by mouth daily.  2  . valACYclovir (VALTREX) 500 MG tablet Take 500 mg by mouth daily as needed (fever blisters).     Jessica Bradford. XHANCE 93 MCG/ACT EXHU BLOW TWO DOSES IN EACH NOSTRIL TWICE DAILY AS DIRECTED. 32 mL 3   No current facility-administered medications for this visit.     Allergies:   Sulfa antibiotics, Doxycycline, and Sulfur    Social History:  The patient  reports that she has never smoked. She has never used smokeless tobacco. She reports current alcohol use of about 2.0 standard drinks of alcohol per week. She reports that she does not use drugs.   Family History:  The patient's family history includes Alcohol abuse in her brother, brother, father, and sister; Cancer (age of onset: 2266) in her paternal grandfather; Dementia in her paternal grandmother; Drug abuse in her brother and brother; Heart attack in her brother and mother; Heart disease in her maternal grandfather and maternal grandmother; Hypertension in her father and paternal grandmother; Other in her paternal grandfather; Stroke in her father.    ROS:  Please see the history of present illness.    Physical Exam: Blood pressure 112/78, pulse 68, height 5\' 7"  (1.702 m), weight 217 lb 12.8 oz (98.8 kg), SpO2 98 %.  GEN:  Well nourished, well developed in no acute distress,  HEENT: Normal NECK: No JVD; No carotid bruits LYMPHATICS: No lymphadenopathy CARDIAC: RRR , very soft systolic murmur , rubs, gallops RESPIRATORY:  Clear to auscultation without rales, wheezing or rhonchi  ABDOMEN: Soft, non-tender, non-distended MUSCULOSKELETAL:  No edema; No deformity  SKIN: Warm and dry NEUROLOGIC:  Alert and oriented x 3    EKG:    March 02, 2019  :  NSR at 6968.   Junctional ST depression    Recent Labs: 10/16/2018: ALT 22; BUN 20; Creatinine, Ser 0.92; Hemoglobin 13.2; Platelets 173.0; Potassium 4.6; Sodium 142; TSH 2.14    Lipid Panel    Component Value Date/Time   CHOL 145 10/16/2018 0940   CHOL 146 02/16/2018 0758   CHOL 136 07/11/2013 0925   TRIG  58.0 10/16/2018 0940   TRIG 96 07/11/2013 0925   HDL 64.60 10/16/2018 0940   HDL 57 02/16/2018 0758   HDL 57 07/11/2013 0925   CHOLHDL 2 10/16/2018 0940   VLDL 11.6 10/16/2018 0940   LDLCALC 68 10/16/2018 0940   LDLCALC 74 02/16/2018 0758   LDLCALC 60 07/11/2013 0925      Wt Readings from Last 3 Encounters:  03/02/19 217 lb 12.8 oz (98.8 kg)  10/24/18 214 lb 8 oz (97.3 kg)  10/16/18 219 lb (99.3 kg)      Other studies Reviewed: Additional studies/ records that were reviewed today include: . Review of the above records demonstrates:   ASSESSMENT AND PLAN:  1.   Dizziness:   Some aspects are Concerning for SVT.  Will get a monitor for further evaluation .    Sounds like orthostasis.   Encouraged her to eat more protein and salt.   V=-8 daily .  Will get a 30 day monitor .   2.  Hyperliidemia-   Labs look great   3.  Fatigue :     4.  Systolic murmur:   Has had tirvial MR.   Will get an echo to reevaluate.  Follow up in 3 months    Current medicines are reviewed at length with the patient today.  The patient does not have concerns regarding medicines.  The following changes have been made:  no change  Labs/ tests ordered today include:   Orders Placed This Encounter  Procedures  . Cardiac event monitor  . EKG 12-Lead  . ECHOCARDIOGRAM COMPLETE      Disposition:   FU with me in 1 year     Kristeen MissPhilip Previn Jian, MD  03/02/2019 6:51 PM    [facility] [address], [place], [place]  [address] Phone: [phone]; Fax: [phone]

## 2019-03-02 NOTE — Telephone Encounter (Signed)
Enrolled patient for a 30 Day [employer] Event monitor to be mailed. Address and insurance info was verified at checkout and patient knows to expect the monitor to arrive in 3-4 days.

## 2019-03-05 ENCOUNTER — Other Ambulatory Visit: Payer: Self-pay | Admitting: Cardiovascular Disease

## 2019-03-06 ENCOUNTER — Ambulatory Visit: Payer: BLUE CROSS/BLUE SHIELD | Admitting: Allergy and Immunology

## 2019-03-06 ENCOUNTER — Encounter: Payer: Self-pay | Admitting: Allergy and Immunology

## 2019-03-06 ENCOUNTER — Other Ambulatory Visit: Payer: Self-pay

## 2019-03-06 VITALS — BP 110/62 | HR 67 | Temp 98.1°F | Resp 18

## 2019-03-06 DIAGNOSIS — R05 Cough: Secondary | ICD-10-CM

## 2019-03-06 DIAGNOSIS — J3089 Other allergic rhinitis: Secondary | ICD-10-CM | POA: Diagnosis not present

## 2019-03-06 DIAGNOSIS — J4540 Moderate persistent asthma, uncomplicated: Secondary | ICD-10-CM

## 2019-03-06 DIAGNOSIS — R0609 Other forms of dyspnea: Secondary | ICD-10-CM | POA: Diagnosis not present

## 2019-03-06 DIAGNOSIS — R053 Chronic cough: Secondary | ICD-10-CM

## 2019-03-06 MED ORDER — BUDESONIDE-FORMOTEROL FUMARATE 80-4.5 MCG/ACT IN AERO: 2.0000 | INHALATION_SPRAY | Freq: Two times a day (BID) | RESPIRATORY_TRACT | 5 refills | Status: DC

## 2019-03-06 NOTE — Assessment & Plan Note (Signed)
Well-controlled.  Continue Symbicort (budesonide/formoterol) 80/4.5 g, 2 inhalations via spacer device twice a day, montelukast 10 mg daily at bedtime, and albuterol HFA, 1 to 2 inhalations every 4-6 hours if needed.

## 2019-03-06 NOTE — Progress Notes (Signed)
Follow-up Note  RE: Jessica Capriceammy L Zetino MRN: [id number] DOB: 01-01-1966 Date of Office Visit: 03/06/2019  Primary care provider: Bradd CanaryBlyth, Stacey A, MD Referring provider: Bradd CanaryBlyth, Stacey A, MD  History of present illness: Jessica Bradford is a 53 y.o. female with persistent asthma, allergic rhinoconjunctivitis, and history of persistent cough presenting today for follow-up.  She was last evaluated via telemedicine on December 05, 2018.  She reports that recently she has been spearing seeing the sensation of difficulty "getting a full breath in."  On occasion, she is able to achieve a deep, satisfying yawn which provides temporary relief.  She admits that she has been under more stress lately.  In addition, she notes that on 1 occasion she took Xanax and the breathing problem resolved.  She reports that her asthma has been well controlled and that she has not been experiencing wheezing or coughing.  In fact, the cough has completely resolved while taking the Symbicort and montelukast as prescribed.  She has no nasal allergy symptom complaints today.  Assessment and plan: Other forms of dyspnea The patients sensation of air-hunger, not being able to get a full breath on inspiration, which may eventually be relieved by a yawn suggests sighing dyspnea. Other less likely etiologies include vocal cord dysfunction and asthma. The patients spirometry today was normal.  Diaphragmatic breathing, or belly breathing, has been discussed with the patient as this technique often times relieves sighing dyspnea.  Moderate persistent asthma Well-controlled.  Continue Symbicort (budesonide/formoterol) 80/4.5 g, 2 inhalations via spacer device twice a day, montelukast 10 mg daily at bedtime, and albuterol HFA, 1 to 2 inhalations every 4-6 hours if needed.  Perennial allergic rhinitis  Continue appropriate allergen avoidance measures and nasal saline irrigation as needed.  If needed, may add Xhance nasal spray,  carbinoxamine maleate, and/or guaifenesin (Mucinex).  Cough, persistent Currently quiescent.  Treatment plan as outlined above.   Meds ordered this encounter  Medications  . budesonide-formoterol (SYMBICORT) 80-4.5 MCG/ACT inhaler    Sig: Inhale 2 puffs into the lungs 2 (two) times daily.    Dispense:  1 Inhaler    Refill:  5    Diagnostics: Spirometry:  Normal with an FEV1 of 103% predicted.  Please see scanned spirometry results for details.    Physical examination: Blood pressure 110/62, pulse 67, temperature 98.1 F (36.7 C), temperature source Temporal, resp. rate 18, SpO2 98 %.  General: Alert, interactive, in no acute distress. HEENT: TMs pearly gray, turbinates moderately edematous with clear discharge, post-pharynx mildly erythematous. Neck: Supple without lymphadenopathy. Lungs: Clear to auscultation without wheezing, rhonchi or rales. CV: Normal S1, S2 without murmurs. Skin: Warm and dry, without lesions or rashes.  The following portions of the patient's history were reviewed and updated as appropriate: allergies, current medications, past family history, past medical history, past social history, past surgical history and problem list.  Allergies as of 03/06/2019      Reactions   Sulfa Antibiotics Rash   Doxycycline Rash   Sulfur Rash      Medication List       Accurate as of March 06, 2019  1:18 PM. If you have any questions, ask your nurse or doctor.        ALPRAZolam 0.25 MG tablet Commonly known as: XANAX Take 0.5 tablets by mouth daily as needed.   aspirin 81 MG tablet Take 81 mg by mouth daily.   atorvastatin 80 MG tablet Commonly known as: LIPITOR TAKE 1 TABLET BY MOUTH EVERY  DAY   budesonide-formoterol 80-4.5 MCG/ACT inhaler Commonly known as: Symbicort Inhale 2 puffs into the lungs 2 (two) times daily.   Carbinoxamine Maleate 4 MG Tabs Take 1 tablet (4 mg total) by mouth every 6 (six) hours as needed.   famotidine 20 MG tablet  Commonly known as: PEPCID TAKE 1 TABLET BY MOUTH TWICE A DAY   levocetirizine 5 MG tablet Commonly known as: XYZAL Take 5 mg by mouth every evening.   montelukast 10 MG tablet Commonly known as: SINGULAIR Take 1 tablet (10 mg total) by mouth at bedtime.   multivitamin per tablet Take 1 tablet by mouth daily.   Olopatadine HCl 0.7 % Soln Commonly known as: Pazeo Place 1 drop into both eyes daily as needed.   oxybutynin 15 MG 24 hr tablet Commonly known as: DITROPAN XL Take 1 tablet by mouth daily.   ProAir HFA 108 (90 Base) MCG/ACT inhaler Generic drug: albuterol TAKE 2 PUFFS BY MOUTH EVERY 6 HOURS AS NEEDED FOR WHEEZE OR SHORTNESS OF BREATH   sertraline 50 MG tablet Commonly known as: ZOLOFT Take 1 tablet by mouth daily.   valACYclovir 500 MG tablet Commonly known as: VALTREX Take 500 mg by mouth daily as needed (fever blisters).   Xhance 93 MCG/ACT Exhu Generic drug: Fluticasone Propionate BLOW TWO DOSES IN EACH NOSTRIL TWICE DAILY AS DIRECTED.       Allergies  Allergen Reactions  . Sulfa Antibiotics Rash  . Doxycycline Rash  . Sulfur Rash   Review of systems: Review of systems negative except as noted in HPI / PMHx or noted below: Constitutional: Negative.  HENT: Negative.   Eyes: Negative.  Respiratory: Negative.   Cardiovascular: Negative.  Gastrointestinal: Negative.  Genitourinary: Negative.  Musculoskeletal: Negative.  Neurological: Negative.  Endo/Heme/Allergies: Negative.  Cutaneous: Negative.  Past Medical History:  Diagnosis Date  . Anemia   . Anxiety   . Heart palpitations   . History of chicken pox   . Hyperlipidemia   . Incomplete right bundle branch block   . Mitral valve prolapse   . Urinary incontinence     Family History  Problem Relation Age of Onset  . Stroke Father   . Hypertension Father   . Alcohol abuse Father   . Heart attack Mother   . Alcohol abuse Sister   . Alcohol abuse Brother   . Drug abuse Brother         Heroin  . Heart attack Brother   . Alcohol abuse Brother   . Drug abuse Brother        THC  . Heart disease Maternal Grandmother   . Heart disease Maternal Grandfather   . Hypertension Paternal Grandmother   . Dementia Paternal Grandmother   . Cancer Paternal Grandfather 35       lung, smoker  . Other Paternal Grandfather        noncancerous tumor    Social History   Socioeconomic History  . Marital status: Married    Spouse name: Not on file  . Number of children: Not on file  . Years of education: Not on file  . Highest education level: Not on file  Occupational History  . Not on file  Social Needs  . Financial resource strain: Not on file  . Food insecurity    Worry: Not on file    Inability: Not on file  . Transportation needs    Medical: Not on file    Non-medical: Not on file  Tobacco Use  .  Smoking status: Never Smoker  . Smokeless tobacco: Never Used  Substance and Sexual Activity  . Alcohol use: Yes    Alcohol/week: 2.0 standard drinks    Types: 2 Cans of beer per week  . Drug use: No  . Sexual activity: Yes    Birth control/protection: Surgical    Comment: Tubal  Lifestyle  . Physical activity    Days per week: Not on file    Minutes per session: Not on file  . Stress: Not on file  Relationships  . Social Musicianconnections    Talks on phone: Not on file    Gets together: Not on file    Attends religious service: Not on file    Active member of club or organization: Not on file    Attends meetings of clubs or organizations: Not on file    Relationship status: Not on file  . Intimate partner violence    Fear of current or ex partner: Not on file    Emotionally abused: Not on file    Physically abused: Not on file    Forced sexual activity: Not on file  Other Topics Concern  . Not on file  Social History Narrative   Works for [employer], no major dietary restrictions, lives with husband and their dog    I appreciate the opportunity to take part in  Natane's care. Please do not hesitate to contact me with questions.  Sincerely,   R. Jorene Guestarter Justice Milliron, MD

## 2019-03-06 NOTE — Assessment & Plan Note (Signed)
   Continue appropriate allergen avoidance measures and nasal saline irrigation as needed.  If needed, may add Xhance nasal spray, carbinoxamine maleate, and/or guaifenesin (Mucinex).

## 2019-03-06 NOTE — Assessment & Plan Note (Signed)
The patients sensation of air-hunger, not being able to get a full breath on inspiration, which may eventually be relieved by a yawn suggests sighing dyspnea. Other less likely etiologies include vocal cord dysfunction and asthma. The patients spirometry today was normal.  Diaphragmatic breathing, or belly breathing, has been discussed with the patient as this technique often times relieves sighing dyspnea.

## 2019-03-06 NOTE — Patient Instructions (Addendum)
Other forms of dyspnea The patients sensation of air-hunger, not being able to get a full breath on inspiration, which may eventually be relieved by a yawn suggests sighing dyspnea. Other less likely etiologies include vocal cord dysfunction and asthma. The patients spirometry today was normal.  Diaphragmatic breathing, or belly breathing, has been discussed with the patient as this technique often times relieves sighing dyspnea.  Moderate persistent asthma Well-controlled.  Continue Symbicort (budesonide/formoterol) 80/4.5 g, 2 inhalations via spacer device twice a day, montelukast 10 mg daily at bedtime, and albuterol HFA, 1 to 2 inhalations every 4-6 hours if needed.  Perennial allergic rhinitis  Continue appropriate allergen avoidance measures and nasal saline irrigation as needed.  If needed, may add Xhance nasal spray, carbinoxamine maleate, and/or guaifenesin (Mucinex).  Cough, persistent Currently quiescent.  Treatment plan as outlined above.   Return in about 4 months (around 07/07/2019), or if symptoms worsen or fail to improve.

## 2019-03-06 NOTE — Assessment & Plan Note (Signed)
Currently quiescent.  Treatment plan as outlined above. 

## 2019-03-08 ENCOUNTER — Other Ambulatory Visit: Payer: Self-pay

## 2019-03-08 ENCOUNTER — Ambulatory Visit (HOSPITAL_COMMUNITY): Payer: BC Managed Care – PPO | Attending: Internal Medicine

## 2019-03-08 DIAGNOSIS — I951 Orthostatic hypotension: Secondary | ICD-10-CM

## 2019-03-08 DIAGNOSIS — R011 Cardiac murmur, unspecified: Secondary | ICD-10-CM | POA: Insufficient documentation

## 2019-03-12 ENCOUNTER — Encounter (INDEPENDENT_AMBULATORY_CARE_PROVIDER_SITE_OTHER): Payer: BC Managed Care – PPO

## 2019-03-12 DIAGNOSIS — I951 Orthostatic hypotension: Secondary | ICD-10-CM

## 2019-03-12 DIAGNOSIS — R42 Dizziness and giddiness: Secondary | ICD-10-CM | POA: Diagnosis not present

## 2019-03-12 DIAGNOSIS — R011 Cardiac murmur, unspecified: Secondary | ICD-10-CM | POA: Diagnosis not present

## 2019-03-23 ENCOUNTER — Other Ambulatory Visit: Payer: Self-pay | Admitting: Family Medicine

## 2019-04-10 DIAGNOSIS — R1030 Lower abdominal pain, unspecified: Secondary | ICD-10-CM | POA: Diagnosis not present

## 2019-04-10 DIAGNOSIS — M549 Dorsalgia, unspecified: Secondary | ICD-10-CM | POA: Diagnosis not present

## 2019-04-23 ENCOUNTER — Telehealth: Payer: Self-pay | Admitting: Nurse Practitioner

## 2019-04-23 NOTE — Telephone Encounter (Signed)
Reviewed monitor results with patient who verbalized understanding. She states she has not had any further episodes of syncope. Patient had appointment with Dr. Elease HashimotoNahser for October 12 that she requests to reschedule. I rescheduled her to a virtual appointment on 9/30. She thanked me for the call.   YOUR CARDIOLOGY TEAM HAS ARRANGED FOR AN E-VISIT FOR YOUR APPOINTMENT - PLEASE REVIEW IMPORTANT INFORMATION BELOW SEVERAL DAYS PRIOR TO YOUR APPOINTMENT  Due to the recent COVID-19 pandemic, we are transitioning in-person office visits to tele-medicine visits in an effort to decrease unnecessary exposure to our patients, their families, and staff. These visits are billed to your insurance just like a normal visit is. We also encourage you to sign up for MyChart if you have not already done so. You will need a smartphone if possible. For patients that do not have this, we can still complete the visit using a regular telephone but do prefer a smartphone to enable video when possible. You may have a family member that lives with you that can help. If possible, we also ask that you have a blood pressure cuff and scale at home to measure your blood pressure, heart rate and weight prior to your scheduled appointment. Patients with clinical needs that need an in-person evaluation and testing will still be able to come to the office if absolutely necessary. If you have any questions, feel free to call our office.     YOUR PROVIDER WILL BE USING THE FOLLOWING PLATFORM TO COMPLETE YOUR VISIT: Doxy.Me   . IF USING DOXIMITY or DOXY.ME - The staff will give you instructions on receiving your link to join the meeting the day of your visit.    2-3 DAYS BEFORE YOUR APPOINTMENT  You will receive a telephone call from one of our [facility] team members - your caller ID may say "Unknown caller." If this is a video visit, we will walk you through how to get the video launched on your phone. We will remind you check your blood  pressure, heart rate and weight prior to your scheduled appointment. If you have an Apple Watch or Kardia, please upload any pertinent ECG strips the day before or morning of your appointment to MyChart. Our staff will also make sure you have reviewed the consent and agree to move forward with your scheduled tele-health visit.    THE DAY OF YOUR APPOINTMENT  Approximately 15 minutes prior to your scheduled appointment, you will receive a telephone call from one of [facility] team - your caller ID may say "Unknown caller."  Our staff will confirm medications, vital signs for the day and any symptoms you may be experiencing. Please have this information available prior to the time of visit start. It may also be helpful for you to have a pad of paper and pen handy for any instructions given during your visit. They will also walk you through joining the smartphone meeting if this is a video visit.   CONSENT FOR TELE-HEALTH VISIT - PLEASE REVIEW  I hereby voluntarily request, consent and authorize [facility] and its employed or contracted physicians, physician assistants, nurse practitioners or other licensed health care professionals (the Practitioner), to provide me with telemedicine health care services (the "Services") as deemed necessary by the treating Practitioner. I acknowledge and consent to receive the Services by the Practitioner via telemedicine. I understand that the telemedicine visit will involve communicating with the Practitioner through live audiovisual communication technology and the disclosure of certain medical information by electronic transmission.  I acknowledge that I have been given the opportunity to request an in-person assessment or other available alternative prior to the telemedicine visit and am voluntarily participating in the telemedicine visit.  I understand that I have the right to withhold or withdraw my consent to the use of telemedicine in the course of my care at any  time, without affecting my right to future care or treatment, and that the Practitioner or I may terminate the telemedicine visit at any time. I understand that I have the right to inspect all information obtained and/or recorded in the course of the telemedicine visit and may receive copies of available information for a reasonable fee.  I understand that some of the potential risks of receiving the Services via telemedicine include:  Marland Kitchen Delay or interruption in medical evaluation due to technological equipment failure or disruption; . Information transmitted may not be sufficient (e.g. poor resolution of images) to allow for appropriate medical decision making by the Practitioner; and/or  . In rare instances, security protocols could fail, causing a breach of personal health information.  Furthermore, I acknowledge that it is my responsibility to provide information about my medical history, conditions and care that is complete and accurate to the best of my ability. I acknowledge that Practitioner's advice, recommendations, and/or decision may be based on factors not within their control, such as incomplete or inaccurate data provided by me or distortions of diagnostic images or specimens that may result from electronic transmissions. I understand that the practice of medicine is not an exact science and that Practitioner makes no warranties or guarantees regarding treatment outcomes. I acknowledge that I will receive a copy of this consent concurrently upon execution via email to the email address I last provided but may also request a printed copy by calling the office of [place].    I understand that my insurance will be billed for this visit.   I have read or had this consent read to me. . I understand the contents of this consent, which adequately explains the benefits and risks of the Services being provided via telemedicine.  . I have been provided ample opportunity to ask questions  regarding this consent and the Services and have had my questions answered to my satisfaction. . I give my informed consent for the services to be provided through the use of telemedicine in my medical care  By participating in this telemedicine visit I agree to the above.

## 2019-04-23 NOTE — Telephone Encounter (Signed)
-----   Message from Thayer Headings, MD sent at 04/20/2019  5:02 PM EDT ----- Sinus rhythm including sinus tach and sinus brady  No serious arrhythmias seen

## 2019-05-14 ENCOUNTER — Other Ambulatory Visit: Payer: Self-pay | Admitting: Family Medicine

## 2019-05-22 ENCOUNTER — Other Ambulatory Visit: Payer: Self-pay | Admitting: Allergy and Immunology

## 2019-05-22 DIAGNOSIS — R6882 Decreased libido: Secondary | ICD-10-CM | POA: Diagnosis not present

## 2019-05-23 ENCOUNTER — Telehealth (INDEPENDENT_AMBULATORY_CARE_PROVIDER_SITE_OTHER): Payer: BC Managed Care – PPO | Admitting: Cardiovascular Disease

## 2019-05-23 ENCOUNTER — Encounter: Payer: Self-pay | Admitting: Cardiovascular Disease

## 2019-05-23 VITALS — BP 117/71 | HR 61 | Ht 66.0 in | Wt 215.0 lb

## 2019-05-23 DIAGNOSIS — E782 Mixed hyperlipidemia: Secondary | ICD-10-CM | POA: Diagnosis not present

## 2019-05-23 DIAGNOSIS — I951 Orthostatic hypotension: Secondary | ICD-10-CM

## 2019-05-23 DIAGNOSIS — Z7189 Other specified counseling: Secondary | ICD-10-CM

## 2019-05-23 NOTE — Progress Notes (Signed)
Virtual Visit via Video Note   This visit type was conducted due to national recommendations for restrictions regarding the COVID-19 Pandemic (e.g. social distancing) in an effort to limit this patient's exposure and mitigate transmission in our community.  Due to her co-morbid illnesses, this patient is at least at moderate risk for complications without adequate follow up.  This format is felt to be most appropriate for this patient at this time.  All issues noted in this document were discussed and addressed.  A limited physical exam was performed with this format.  Please refer to the patient's chart for her consent to telehealth for [facility].   Date:  05/23/2019   ID:  Jessica Bradford, DOB 1966-05-30, MRN [id number]  Patient Location: Home Provider Location: Home  PCP:  Mosie Lukes, MD  Cardiologist:  Mertie Moores, MD  Electrophysiologist:  None   1. Hyperliidemia 2. Dizziness    Previous notes:  Jessica Bradford is a 53 year old female with a history of hypercholesterolemia. She has a very strong family history of coronary artery disease. She's done very well since she last saw Dr. Doreatha Lew last year. She walks 4-6 miles every day. She denies any chest pain or shortness of breath with her walking.  She has a history of mitral valve prolapse. She has occasional palpitations. These typically occur when she is at rest.  She is recovering from an episode of cellulitis. She apparently stepped on a splinter on her deck and developed cellulitis in her foot. She was on antibiotics for several weeks. She is still not exercising because of some persistent foot pain and swelling.  Nov. 19, 2014:  No CP, no dyspnea, Still walking 5+ miles a day. Also doing strength training. She gets a discount from her insurance plan to work out    Dec 23, 2014:  Jessica Bradford is a 53 y.o. female who presents for  Follow up of her hyperlipidemia Walks regularly,  Also does  strength training.  Trying to watch her diet.   Jan 07, 2016: Doing well  Has had several episodes of waking up "thinking she was not breathing " Snores some but does not have apneic episodes according to husband  Exercising some .   [place] twice a week , walks the other days  Has had difficulty losing weight .   January 26, 2017:  Jessica Bradford is doing well from a cardiac standpoint Is very fatigued for the past 6 months . Is tired all of the time  Takes naps frequently  Still working out at [employer] - works outs are going ok .   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    Is losing some hair ( which is some concern)   March 02, 2019 :  Jessica Bradford is see today for episodes of dizziness.  Hx of hyperlipidemia  3 times over the past 7 weeks. Near syncope.   Feels fine and then has palpitations,  Legs feel like jello and has near syncope  One episode happened at [facility],  She bought a BP cuff,  BP was 95/64 when she got home Has not occurred since June 27.  Drinks water  All have occurred as she is standing up  From a seated position .  2 episodes at lunch   Breakfast - oatmeal  Or toast, yogart Lunch - salad or sandwich  Dinner - frequently does not have meat .    Echo in 2012 was normal.  Has worn a monitor about 10 years ago    Evaluation Performed:  Follow-Up Visit  Chief Complaint:  Dizziness, hyperlipidemia   History of Present Illness:    Jessica Bradford is a 53 y.o. female with  Hx of hyperlipidemia and dizziness.   Has done well. Has a mild episode of dizziness while cleaning her house  Exercising regulary , No CP  Labs from Feb. Look great  Event monitor looked good. Sinus brady and sinus tach Has increased and protein    The patient does not have symptoms concerning for COVID-19 infection (fever, chills, cough, or new shortness of breath).    Past Medical  History:  Diagnosis Date  . Anemia   . Anxiety   . Heart palpitations   . History of chicken pox   . Hyperlipidemia   . Incomplete right bundle branch block   . Mitral valve prolapse   . Urinary incontinence    Past Surgical History:  Procedure Laterality Date  . CHOLECYSTECTOMY    . TUBAL LIGATION  1996  . TUBAL LIGATION       Current Meds  Medication Sig  . ALPRAZolam (XANAX) 0.25 MG tablet Take 0.5 tablets by mouth daily as needed.  Marland Kitchen aspirin 81 MG tablet Take 81 mg by mouth daily.    Marland Kitchen atorvastatin (LIPITOR) 80 MG tablet TAKE 1 TABLET BY MOUTH EVERY DAY  . budesonide-formoterol (SYMBICORT) 80-4.5 MCG/ACT inhaler Inhale 2 puffs into the lungs 2 (two) times daily.  . Carbinoxamine Maleate 4 MG TABS Take 1 tablet (4 mg total) by mouth every 6 (six) hours as needed.  . famotidine (PEPCID) 20 MG tablet TAKE 1 TABLET BY MOUTH TWICE A DAY  . levocetirizine (XYZAL) 5 MG tablet Take 5 mg by mouth every evening.  . montelukast (SINGULAIR) 10 MG tablet Take 1 tablet (10 mg total) by mouth at bedtime.  . multivitamin (THERAGRAN) per tablet Take 1 tablet by mouth daily.    Marland Kitchen oxybutynin (DITROPAN XL) 15 MG 24 hr tablet Take 1 tablet by mouth daily.  Marland Kitchen PAZEO 0.7 % SOLN PLACE 1 DROP INTO BOTH EYES DAILY AS NEEDED.  Marland Kitchen PROAIR HFA 108 (90 Base) MCG/ACT inhaler TAKE 2 PUFFS BY MOUTH EVERY 6 HOURS AS NEEDED FOR WHEEZE OR SHORTNESS OF BREATH  . sertraline (ZOLOFT) 50 MG tablet Take 1 tablet by mouth daily.  . valACYclovir (VALTREX) 500 MG tablet Take 500 mg by mouth daily as needed (fever blisters).   [name] 93 MCG/ACT EXHU BLOW TWO DOSES IN EACH NOSTRIL TWICE DAILY AS DIRECTED.     Allergies:   Sulfa antibiotics, Doxycycline, and Sulfur   Social History   Tobacco Use  . Smoking status: Never Smoker  . Smokeless tobacco: Never Used  Substance Use Topics  . Alcohol use: Yes    Alcohol/week: 2.0 standard drinks    Types: 2 Cans of beer per week  . Drug use: No     Family Hx: The  patient's family history includes Alcohol abuse in her brother, brother, father, and sister; Cancer (age of onset: 70) in her paternal grandfather; Dementia in her paternal grandmother; Drug abuse in her brother and brother; Heart attack in her brother and mother; Heart disease in her maternal grandfather and maternal grandmother; Hypertension in her father and paternal grandmother; Other in her paternal grandfather; Stroke in her father.  ROS:   Please see  the history of present illness.     All other systems reviewed and are negative.   Prior CV studies:   The following studies were reviewed today:    Labs/Other Tests and Data Reviewed:    EKG:  No ECG reviewed.  Recent Labs: 10/16/2018: ALT 22; BUN 20; Creatinine, Ser 0.92; Hemoglobin 13.2; Platelets 173.0; Potassium 4.6; Sodium 142; TSH 2.14   Recent Lipid Panel Lab Results  Component Value Date/Time   CHOL 145 10/16/2018 09:40 AM   CHOL 146 02/16/2018 07:58 AM   CHOL 136 07/11/2013 09:25 AM   TRIG 58.0 10/16/2018 09:40 AM   TRIG 96 07/11/2013 09:25 AM   HDL 64.60 10/16/2018 09:40 AM   HDL 57 02/16/2018 07:58 AM   HDL 57 07/11/2013 09:25 AM   CHOLHDL 2 10/16/2018 09:40 AM   LDLCALC 68 10/16/2018 09:40 AM   LDLCALC 74 02/16/2018 07:58 AM   LDLCALC 60 07/11/2013 09:25 AM    Wt Readings from Last 3 Encounters:  05/23/19 215 lb (97.5 kg)  03/02/19 217 lb 12.8 oz (98.8 kg)  10/24/18 214 lb 8 oz (97.3 kg)     Objective:    Vital Signs:  BP 117/71   Pulse 61   Ht 5\' 6"  (1.676 m)   Wt 215 lb (97.5 kg)   BMI 34.70 kg/m    VITAL SIGNS:  reviewed GEN:  no acute distress EYES:  sclerae anicteric, EOMI - Extraocular Movements Intact RESPIRATORY:  normal respiratory effort, symmetric expansion CARDIOVASCULAR:  no peripheral edema SKIN:  no rash, lesions or ulcers. MUSCULOSKELETAL:  no obvious deformities. NEURO:  alert and oriented x 3, no obvious focal deficit PSYCH:  normal affect  ASSESSMENT & PLAN:    1.  Orthostatic hypotension: Jessica Bradford has increased her intake of protein and salt which has greatly helped her episodes of orthostatic hypotension.  She now only has them rarely.  She is exercising regularly.  Her blood pressure remains well controlled despite eating a little bit of extra salt.  I have encouraged her to continue with a good diet including adequate protein and salt.  I have encouraged her to continue exercising.  Her event monitor showed only sinus rhythm with sinus bradycardia and sinus tachycardia.  She had no significant arrhythmias to explain episodes of presyncope.  I will see her again in 1 year for follow-up visit.  2.  Hyperlipidemia: Her most recent labs are from February, 2020.  All of her labs look fine.  She has a follow-up visit with her primary medical doctor in February.  I encouraged her to continue to eat a low fat diet and to continue with a good exercise program.  COVID-19 Education: The signs and symptoms of COVID-19 were discussed with the patient and how to seek care for testing (follow up with PCP or arrange E-visit).  The importance of social distancing was discussed today.  Time:   Today, I have spent  18 minutes with the patient with telehealth technology discussing the above problems.     Medication Adjustments/Labs and Tests Ordered: Current medicines are reviewed at length with the patient today.  Concerns regarding medicines are outlined above.   Tests Ordered: No orders of the defined types were placed in this encounter.   Medication Changes: No orders of the defined types were placed in this encounter.   Follow Up:  Virtual Visit or In Person in 1 year(s)  Signed, Kristeen MissPhilip Nyeisha Goodall, MD  05/23/2019 10:08 AM     [facility]  

## 2019-05-23 NOTE — Patient Instructions (Signed)
Medication Instructions:  Your provider recommends that you continue on your current medications as directed. Please refer to the Current Medication list given to you today.    Labwork: None  Testing/Procedures: None  Follow-Up: Your provider wants you to follow-up in: 1 year with Dr. Nahser. You will receive a reminder letter in the mail two months in advance. If you don't receive a letter, please call our office to schedule the follow-up appointment.    Any Other Special Instructions Will Be Listed Below (If Applicable).     If you need a refill on your cardiac medications before your next appointment, please call your pharmacy.   

## 2019-05-26 ENCOUNTER — Other Ambulatory Visit: Payer: Self-pay | Admitting: Allergy and Immunology

## 2019-06-04 ENCOUNTER — Ambulatory Visit: Payer: BC Managed Care – PPO | Admitting: Cardiovascular Disease

## 2019-07-04 DIAGNOSIS — L0390 Cellulitis, unspecified: Secondary | ICD-10-CM | POA: Diagnosis not present

## 2019-07-04 DIAGNOSIS — T300 Burn of unspecified body region, unspecified degree: Secondary | ICD-10-CM | POA: Diagnosis not present

## 2019-07-10 ENCOUNTER — Encounter: Payer: Self-pay | Admitting: Allergy and Immunology

## 2019-07-10 ENCOUNTER — Ambulatory Visit (INDEPENDENT_AMBULATORY_CARE_PROVIDER_SITE_OTHER): Payer: BC Managed Care – PPO | Admitting: Allergy and Immunology

## 2019-07-10 ENCOUNTER — Other Ambulatory Visit: Payer: Self-pay

## 2019-07-10 VITALS — BP 114/72 | HR 66 | Temp 97.6°F | Resp 18

## 2019-07-10 DIAGNOSIS — J3089 Other allergic rhinitis: Secondary | ICD-10-CM | POA: Diagnosis not present

## 2019-07-10 DIAGNOSIS — J4540 Moderate persistent asthma, uncomplicated: Secondary | ICD-10-CM

## 2019-07-10 MED ORDER — BUDESONIDE-FORMOTEROL FUMARATE 160-4.5 MCG/ACT IN AERO: 2.0000 | INHALATION_SPRAY | Freq: Two times a day (BID) | RESPIRATORY_TRACT | 5 refills | Status: DC

## 2019-07-10 NOTE — Assessment & Plan Note (Signed)
Todays spirometry results, assessed while asymptomatic, suggest under-perception of bronchoconstriction.  A prescription has been provided for Symbicort (budesonide/formoterol) 160/4.5 g, 2 inhalations via spacer device twice a day.  Continue montelukast 10 mg daily at bedtime and albuterol HFA, 1 to 2 inhalations every 4-6 hours if needed.  Subjective and objective measures of pulmonary function will be followed and the treatment plan will be adjusted accordingly.

## 2019-07-10 NOTE — Assessment & Plan Note (Signed)
   Continue appropriate allergen avoidance measures and nasal saline irrigation, Xhance nasal spray as needed, and carbinoxamine maleate as needed.  For thick post nasal drainage, add guaifenesin 1200 mg (Mucinex Maximum Strength)  twice daily as needed with adequate hydration as discussed.

## 2019-07-10 NOTE — Progress Notes (Signed)
Follow-up Note  RE: Jessica Bradford MRN: [id number] DOB: 05-18-1966 Date of Office Visit: 07/10/2019  Primary care provider: Bradd CanaryBlyth, Stacey A, MD Referring provider: Bradd CanaryBlyth, Stacey A, MD  History of present illness: Jessica Bradford is a 53 y.o. female with persistent asthma, allergic rhinoconjunctivitis, and history of persistent cough presenting today for follow-up.  She was last seen in this clinic on March 06, 2019. She reports that every 2 weeks when she has to go into her place of employment she experiences increased nasal congestion, post nasal drainage, and dyspnea especially in the evening. She is uncertain if there is mold in her work place. She is currently taking Symbicort 80/4.5 mcg, 2 inhalations via spacer device twice daily, and montelukast 10 mg daily. She is using Xhance twice daily.   Assessment and plan: Moderate persistent asthma Todays spirometry results, assessed while asymptomatic, suggest under-perception of bronchoconstriction.  A prescription has been provided for Symbicort (budesonide/formoterol) 160/4.5 g, 2 inhalations via spacer device twice a day.  Continue montelukast 10 mg daily at bedtime and albuterol HFA, 1 to 2 inhalations every 4-6 hours if needed.  Subjective and objective measures of pulmonary function will be followed and the treatment plan will be adjusted accordingly.  Perennial allergic rhinitis  Continue appropriate allergen avoidance measures and nasal saline irrigation, Xhance nasal spray as needed, and carbinoxamine maleate as needed.  For thick post nasal drainage, add guaifenesin 1200 mg (Mucinex Maximum Strength)  twice daily as needed with adequate hydration as discussed.   Meds ordered this encounter  Medications   budesonide-formoterol (SYMBICORT) 160-4.5 MCG/ACT inhaler    Sig: Inhale 2 puffs into the lungs 2 (two) times daily.    Dispense:  1 Inhaler    Refill:  5    Diagnostics: Spirometry reveals an FVC of 3.86 L  (101% predicted) and an FEV1 of 1.93 L (64% predicted) with significant (76%) postbronchodilator improvement.  This study was performed while the patient was asymptomatic.  Please see scanned spirometry results for details.    Physical examination: Blood pressure 114/72, pulse 66, temperature 97.6 F (36.4 C), temperature source Temporal, resp. rate 18, SpO2 96 %.  General: Alert, interactive, in no acute distress. HEENT: TMs pearly gray, turbinates mildly edematous without discharge, post-pharynx moderately erythematous. Neck: Supple without lymphadenopathy. Lungs: Clear to auscultation without wheezing, rhonchi or rales. CV: Normal S1, S2 without murmurs. Skin: Warm and dry, without lesions or rashes.  The following portions of the patient's history were reviewed and updated as appropriate: allergies, current medications, past family history, past medical history, past social history, past surgical history and problem list.  Current Outpatient Medications  Medication Sig Dispense Refill   ALPRAZolam (XANAX) 0.25 MG tablet Take 0.5 tablets by mouth daily as needed.  1   aspirin 81 MG tablet Take 81 mg by mouth daily.       atorvastatin (LIPITOR) 80 MG tablet TAKE 1 TABLET BY MOUTH EVERY DAY 90 tablet 3   Carbinoxamine Maleate 4 MG TABS Take 1 tablet (4 mg total) by mouth every 6 (six) hours as needed. 28 each 5   clindamycin (CLEOCIN) 300 MG capsule Take 300 mg by mouth every 6 (six) hours.     diclofenac Sodium (VOLTAREN) 1 % GEL diclofenac 1 % topical gel  APPLY 2 GRAMS TO THE AFFECTED AREA(S) BY TOPICAL ROUTE 4 TIMES PER DAY     famotidine (PEPCID) 20 MG tablet TAKE 1 TABLET BY MOUTH TWICE A DAY 50 tablet 2  levocetirizine (XYZAL) 5 MG tablet Take 5 mg by mouth every evening.     montelukast (SINGULAIR) 10 MG tablet TAKE 1 TABLET BY MOUTH AT BEDTIME GENERIC EQUIVALENT FOR SINGULAIR 90 tablet 0   multivitamin (THERAGRAN) per tablet Take 1 tablet by mouth daily.        mupirocin ointment (BACTROBAN) 2 % APPLY TO AFFECTED AREA 3 TIMES A DAY FOR 7 DAYS     oxybutynin (DITROPAN XL) 15 MG 24 hr tablet Take 1 tablet by mouth daily.  3   PAZEO 0.7 % SOLN PLACE 1 DROP INTO BOTH EYES DAILY AS NEEDED. 2.5 mL 1   PROAIR HFA 108 (90 Base) MCG/ACT inhaler TAKE 2 PUFFS BY MOUTH EVERY 6 HOURS AS NEEDED FOR WHEEZE OR SHORTNESS OF BREATH 8.5 g 2   sertraline (ZOLOFT) 50 MG tablet Take 1 tablet by mouth daily.  2   valACYclovir (VALTREX) 500 MG tablet Take 500 mg by mouth daily as needed (fever blisters).      XHANCE 93 MCG/ACT EXHU BLOW TWO DOSES IN EACH NOSTRIL TWICE DAILY AS DIRECTED. 32 mL 3   budesonide-formoterol (SYMBICORT) 160-4.5 MCG/ACT inhaler Inhale 2 puffs into the lungs 2 (two) times daily. 1 Inhaler 5   No current facility-administered medications for this visit.     Allergies  Allergen Reactions   Sulfa Antibiotics Rash   Doxycycline Rash   Sulfur Rash    Review of systems: Review of systems negative except as noted in HPI / PMHx or noted below: Constitutional: Negative.  HENT: Negative.   Eyes: Negative.  Respiratory: Negative.   Cardiovascular: Negative.  Gastrointestinal: Negative.  Genitourinary: Negative.  Musculoskeletal: Negative.  Neurological: Negative.  Endo/Heme/Allergies: Negative.  Cutaneous: Negative.   Past Medical History:  Diagnosis Date   Anemia    Anxiety    Heart palpitations    History of chicken pox    Hyperlipidemia    Incomplete right bundle branch block    Mitral valve prolapse    Urinary incontinence     Family History  Problem Relation Age of Onset   Stroke Father    Hypertension Father    Alcohol abuse Father    Heart attack Mother    Alcohol abuse Sister    Alcohol abuse Brother    Drug abuse Brother        Heroin   Heart attack Brother    Alcohol abuse Brother    Drug abuse Brother        THC   Heart disease Maternal Grandmother    Heart disease Maternal  Grandfather    Hypertension Paternal Grandmother    Dementia Paternal Grandmother    Cancer Paternal Grandfather 66       lung, smoker   Other Paternal Grandfather        noncancerous tumor    Social History   Socioeconomic History   Marital status: Married    Spouse name: Not on file   Number of children: Not on file   Years of education: Not on file   Highest education level: Not on file  Occupational History   Not on file  Social Needs   Financial resource strain: Not on file   Food insecurity    Worry: Not on file    Inability: Not on file   Transportation needs    Medical: Not on file    Non-medical: Not on file  Tobacco Use   Smoking status: Never Smoker   Smokeless tobacco: Never Used  Substance and Sexual Activity   Alcohol use: Yes    Alcohol/week: 2.0 standard drinks    Types: 2 Cans of beer per week   Drug use: No   Sexual activity: Yes    Birth control/protection: Surgical    Comment: Tubal  Lifestyle   Physical activity    Days per week: Not on file    Minutes per session: Not on file   Stress: Not on file  Relationships   Social connections    Talks on phone: Not on file    Gets together: Not on file    Attends religious service: Not on file    Active member of club or organization: Not on file    Attends meetings of clubs or organizations: Not on file    Relationship status: Not on file   Intimate partner violence    Fear of current or ex partner: Not on file    Emotionally abused: Not on file    Physically abused: Not on file    Forced sexual activity: Not on file  Other Topics Concern   Not on file  Social History Narrative   Works for [employer], no major dietary restrictions, lives with husband and their dog    I appreciate the opportunity to take part in Elana's care. Please do not hesitate to contact me with questions.  Sincerely,   R. Jorene Guest, MD

## 2019-07-10 NOTE — Patient Instructions (Addendum)
Moderate persistent asthma Todays spirometry results, assessed while asymptomatic, suggest under-perception of bronchoconstriction.  A prescription has been provided for Symbicort (budesonide/formoterol) 160/4.5 g, 2 inhalations via spacer device twice a day.  Continue montelukast 10 mg daily at bedtime and albuterol HFA, 1 to 2 inhalations every 4-6 hours if needed.  Subjective and objective measures of pulmonary function will be followed and the treatment plan will be adjusted accordingly.  Perennial allergic rhinitis  Continue appropriate allergen avoidance measures and nasal saline irrigation, Xhance nasal spray as needed, and carbinoxamine maleate as needed.  For thick post nasal drainage, add guaifenesin 1200 mg (Mucinex Maximum Strength)  twice daily as needed with adequate hydration as discussed.   Return in about 4 months (around 11/07/2019), or if symptoms worsen or fail to improve.

## 2019-08-02 DIAGNOSIS — Z6834 Body mass index (BMI) 34.0-34.9, adult: Secondary | ICD-10-CM | POA: Diagnosis not present

## 2019-08-02 DIAGNOSIS — Z20828 Contact with and (suspected) exposure to other viral communicable diseases: Secondary | ICD-10-CM | POA: Diagnosis not present

## 2019-08-11 ENCOUNTER — Other Ambulatory Visit: Payer: Self-pay | Admitting: Family Medicine

## 2019-08-18 ENCOUNTER — Other Ambulatory Visit: Payer: Self-pay | Admitting: Allergy and Immunology

## 2019-08-21 ENCOUNTER — Other Ambulatory Visit: Payer: Self-pay | Admitting: Allergy and Immunology

## 2019-09-04 DIAGNOSIS — R6882 Decreased libido: Secondary | ICD-10-CM | POA: Diagnosis not present

## 2019-10-15 DIAGNOSIS — Z01419 Encounter for gynecological examination (general) (routine) without abnormal findings: Secondary | ICD-10-CM | POA: Diagnosis not present

## 2019-10-15 DIAGNOSIS — Z6836 Body mass index (BMI) 36.0-36.9, adult: Secondary | ICD-10-CM | POA: Diagnosis not present

## 2019-10-15 DIAGNOSIS — Z1231 Encounter for screening mammogram for malignant neoplasm of breast: Secondary | ICD-10-CM | POA: Diagnosis not present

## 2019-10-16 ENCOUNTER — Other Ambulatory Visit: Payer: Self-pay

## 2019-10-17 ENCOUNTER — Encounter: Payer: Self-pay | Admitting: Family Medicine

## 2019-10-18 ENCOUNTER — Encounter: Payer: BC Managed Care – PPO | Admitting: Family Medicine

## 2019-10-26 ENCOUNTER — Other Ambulatory Visit: Payer: Self-pay | Admitting: Family Medicine

## 2019-11-13 ENCOUNTER — Other Ambulatory Visit: Payer: Self-pay

## 2019-11-13 ENCOUNTER — Ambulatory Visit (INDEPENDENT_AMBULATORY_CARE_PROVIDER_SITE_OTHER): Payer: BC Managed Care – PPO | Admitting: Allergy and Immunology

## 2019-11-13 ENCOUNTER — Telehealth: Payer: Self-pay

## 2019-11-13 ENCOUNTER — Encounter: Payer: Self-pay | Admitting: Allergy and Immunology

## 2019-11-13 VITALS — BP 118/74 | HR 79 | Temp 96.9°F | Resp 17 | Ht 65.6 in | Wt 226.8 lb

## 2019-11-13 DIAGNOSIS — R059 Cough, unspecified: Secondary | ICD-10-CM

## 2019-11-13 DIAGNOSIS — R05 Cough: Secondary | ICD-10-CM

## 2019-11-13 DIAGNOSIS — J3089 Other allergic rhinitis: Secondary | ICD-10-CM | POA: Diagnosis not present

## 2019-11-13 DIAGNOSIS — R040 Epistaxis: Secondary | ICD-10-CM | POA: Insufficient documentation

## 2019-11-13 DIAGNOSIS — J4540 Moderate persistent asthma, uncomplicated: Secondary | ICD-10-CM | POA: Diagnosis not present

## 2019-11-13 MED ORDER — AZELASTINE HCL 0.1 % NA SOLN: 1.0000 | Freq: Two times a day (BID) | NASAL | 12 refills | Status: DC | PRN

## 2019-11-13 NOTE — Assessment & Plan Note (Addendum)
   Proper technique for stanching epistaxis has been discussed and demonstrated.  Nasal saline spray and/or nasal saline gel is recommended to moisturize nasal mucosa.  The use of a cool-mist humidifier during the night is recommended.  During epistaxis, if needed, oxymetazoline (Afrin) nasal spray may be applied to a cotton ball to help stanch the blood flow.  If this problem persists or progresses, otolaryngology evaluation (Dr. Suszanne Conners) may be warranted.

## 2019-11-13 NOTE — Telephone Encounter (Signed)
Please see previous message

## 2019-11-13 NOTE — Assessment & Plan Note (Signed)
Currently well controlled.  For now, continue Symbicort (budesonide/formoterol) 160/4.5 g, 2 inhalations via spacer device twice a day, montelukast 10 mg daily at bedtime and albuterol HFA, 1 to 2 inhalations every 4-6 hours if needed.  Subjective and objective measures of pulmonary function will be followed and the treatment plan will be adjusted accordingly.

## 2019-11-13 NOTE — Telephone Encounter (Signed)
Yes, put in a prescription for fluticasone nasal spray, 2 sprays per nostril daily as needed. Thanks

## 2019-11-13 NOTE — Progress Notes (Signed)
Follow-up Note  RE: Jessica Bradford MRN: [id number] DOB: 1966-01-29 Date of Office Visit: 11/13/2019  Primary care provider: Bradd Canary, MD Referring provider: Bradd Canary, MD  History of present illness: Jessica Bradford is a 54 y.o. female with persistent asthma, allergic rhinoconjunctivitis, and history of persistent cough presenting today for follow-up.  She was last seen in this clinic in November 2020.  She reports that while taking Symbicort 160-4.5 g, 2 inhalations via spacer device twice daily twice daily, and montelukast 10 mg daily, she rarely requires albuterol rescue, 1 time per month on average, and denies limitations in normal daily activities or nocturnal awakenings due to lower respiratory symptoms. She reports that she currently goes into the office for work 1 day every 2 weeks.  She has noticed that on that day that she is in the office she experiences scratchy throat, raspy voice, and coughing.  The cough feels like it originates from the base of her throat.  She does experience increased nasal congestion and postnasal drainage while in the office.  In addition, she reports that she has had epistaxis on 2 occasions while in the office with "pouring blood."  The episodes took approximately 5 minutes to stanch the bleeding.  She uses Xhance on the days that she goes into the office in anticipation of increased nasal symptoms.  Assessment and plan: Moderate persistent asthma Currently well controlled.  For now, continue Symbicort (budesonide/formoterol) 160/4.5 g, 2 inhalations via spacer device twice a day, montelukast 10 mg daily at bedtime and albuterol HFA, 1 to 2 inhalations every 4-6 hours if needed.  Subjective and objective measures of pulmonary function will be followed and the treatment plan will be adjusted accordingly.  Perennial allergic rhinitis  Continue appropriate allergen avoidance measures and carbinoxamine maleate as needed.  Given the  history of epistaxis, discontinue Xhance nasal spray.  A prescription has been provided for azelastine nasal spray, 1-2 sprays per nostril 2 times daily as needed. Proper nasal spray technique has been discussed and demonstrated.   Nasal saline spray (i.e., Simply Saline) or nasal saline lavage (i.e., NeilMed) is recommended as needed and prior to medicated nasal sprays.  For thick post nasal drainage, add guaifenesin 1200 mg (Mucinex Maximum Strength)  twice daily as needed with adequate hydration as discussed.  Epistaxis  Proper technique for stanching epistaxis has been discussed and demonstrated.  Nasal saline spray and/or nasal saline gel is recommended to moisturize nasal mucosa.  The use of a cool-mist humidifier during the night is recommended.  During epistaxis, if needed, oxymetazoline (Afrin) nasal spray may be applied to a cotton ball to help stanch the blood flow.  If this problem persists or progresses, otolaryngology evaluation (Dr. Suszanne Conners) may be warranted.   Coughing The history suggests that the cough is multifactorial with contribution from postnasal drainage and bronchial hyperresponsiveness.  Treatment plan as outlined above for moderate persistent asthma and allergic rhinitis.   Meds ordered this encounter  Medications  . azelastine (ASTELIN) 0.1 % nasal spray    Sig: Place 1-2 sprays into both nostrils 2 (two) times daily as needed for rhinitis. Use in each nostril as directed    Dispense:  30 mL    Refill:  12    Diagnostics: Spirometry:  Normal with an FEV1 of 107% predicted. This study was performed while the patient was asymptomatic.  Please see scanned spirometry results for details.    Physical examination: Blood pressure 118/74, pulse 79, temperature (!) 96.9  F (36.1 C), temperature source Temporal, resp. rate 17, height 5' 5.6" (1.666 m), weight 226 lb 12.8 oz (102.9 kg), SpO2 99 %.  General: Alert, interactive, in no acute distress. HEENT: TMs  pearly gray, turbinates mildly edematous without discharge, post-pharynx mildly erythematous. Neck: Supple without lymphadenopathy. Lungs: Clear to auscultation without wheezing, rhonchi or rales. CV: Normal S1, S2 without murmurs. Skin: Warm and dry, without lesions or rashes.  The following portions of the patient's history were reviewed and updated as appropriate: allergies, current medications, past family history, past medical history, past social history, past surgical history and problem list.  Current Outpatient Medications  Medication Sig Dispense Refill  . ALPRAZolam (XANAX) 0.25 MG tablet Take 0.5 tablets by mouth daily as needed.  1  . aspirin 81 MG tablet Take 81 mg by mouth daily.      Marland Kitchen atorvastatin (LIPITOR) 80 MG tablet TAKE 1 TABLET BY MOUTH EVERY DAY 90 tablet 3  . budesonide-formoterol (SYMBICORT) 160-4.5 MCG/ACT inhaler Inhale 2 puffs into the lungs 2 (two) times daily. 1 Inhaler 5  . Carbinoxamine Maleate 4 MG TABS Take 1 tablet (4 mg total) by mouth every 6 (six) hours as needed. 28 each 5  . famotidine (PEPCID) 20 MG tablet TAKE 1 TABLET BY MOUTH TWICE A DAY 50 tablet 2  . levocetirizine (XYZAL) 5 MG tablet Take 5 mg by mouth every evening.    . montelukast (SINGULAIR) 10 MG tablet TAKE 1 TABLET BY MOUTH AT BEDTIME GENERIC EQUIVALENT FOR SINGULAIR. APPOINTMENT 07/10/19 90 tablet 1  . multivitamin (THERAGRAN) per tablet Take 1 tablet by mouth daily.      Marland Kitchen oxybutynin (DITROPAN XL) 15 MG 24 hr tablet Take 1 tablet by mouth daily.  3  . PAZEO 0.7 % SOLN PLACE 1 DROP INTO BOTH EYES DAILY AS NEEDED. 2.5 mL 1  . PROAIR HFA 108 (90 Base) MCG/ACT inhaler TAKE 2 PUFFS BY MOUTH EVERY 6 HOURS AS NEEDED FOR WHEEZE OR SHORTNESS OF BREATH 8.5 g 2  . sertraline (ZOLOFT) 50 MG tablet Take 1 tablet by mouth daily.  2  . valACYclovir (VALTREX) 500 MG tablet Take 500 mg by mouth daily as needed (fever blisters).     [name] 93 MCG/ACT EXHU BLOW TWO DOSES IN EACH NOSTRIL TWICE DAILY AS  DIRECTED. 32 mL 3  . azelastine (ASTELIN) 0.1 % nasal spray Place 1-2 sprays into both nostrils 2 (two) times daily as needed for rhinitis. Use in each nostril as directed 30 mL 12  . clindamycin (CLEOCIN) 300 MG capsule Take 300 mg by mouth every 6 (six) hours.    . diclofenac Sodium (VOLTAREN) 1 % GEL diclofenac 1 % topical gel  APPLY 2 GRAMS TO THE AFFECTED AREA(S) BY TOPICAL ROUTE 4 TIMES PER DAY    . mupirocin ointment (BACTROBAN) 2 % APPLY TO AFFECTED AREA 3 TIMES A DAY FOR 7 DAYS    . SYMBICORT 80-4.5 MCG/ACT inhaler TAKE 2 PUFFS BY MOUTH TWICE A DAY (Patient not taking: Reported on 11/13/2019) 30.6 Inhaler 4   No current facility-administered medications for this visit.    Allergies  Allergen Reactions  . Sulfa Antibiotics Rash  . Doxycycline Rash  . Sulfur Rash   Review of systems: Review of systems negative except as noted in HPI / PMHx.  Past Medical History:  Diagnosis Date  . Anemia   . Anxiety   . Heart palpitations   . History of chicken pox   . Hyperlipidemia   . Incomplete  right bundle branch block   . Mitral valve prolapse   . Urinary incontinence     Family History  Problem Relation Age of Onset  . Stroke Father   . Hypertension Father   . Alcohol abuse Father   . Heart attack Mother   . Alcohol abuse Sister   . Alcohol abuse Brother   . Drug abuse Brother        Heroin  . Heart attack Brother   . Alcohol abuse Brother   . Drug abuse Brother        THC  . Heart disease Maternal Grandmother   . Heart disease Maternal Grandfather   . Hypertension Paternal Grandmother   . Dementia Paternal Grandmother   . Cancer Paternal Grandfather 49       lung, smoker  . Other Paternal Grandfather        noncancerous tumor    Social History   Socioeconomic History  . Marital status: Married    Spouse name: Not on file  . Number of children: Not on file  . Years of education: Not on file  . Highest education level: Not on file  Occupational History  .  Not on file  Tobacco Use  . Smoking status: Never Smoker  . Smokeless tobacco: Never Used  Substance and Sexual Activity  . Alcohol use: Yes    Alcohol/week: 2.0 standard drinks    Types: 2 Cans of beer per week  . Drug use: No  . Sexual activity: Yes    Birth control/protection: Surgical    Comment: Tubal  Other Topics Concern  . Not on file  Social History Narrative   Works for [employer], no major dietary restrictions, lives with husband and their dog   Social Determinants of Health   Financial Resource Strain:   . Difficulty of Paying Living Expenses:   Food Insecurity:   . Worried About Charity fundraiser in the Last Year:   . Arboriculturist in the Last Year:   Transportation Needs:   . Film/video editor (Medical):   Marland Kitchen Lack of Transportation (Non-Medical):   Physical Activity:   . Days of Exercise per Week:   . Minutes of Exercise per Session:   Stress:   . Feeling of Stress :   Social Connections:   . Frequency of Communication with Friends and Family:   . Frequency of Social Gatherings with Friends and Family:   . Attends Religious Services:   . Active Member of Clubs or Organizations:   . Attends Archivist Meetings:   Marland Kitchen Marital Status:   Intimate Partner Violence:   . Fear of Current or Ex-Partner:   . Emotionally Abused:   Marland Kitchen Physically Abused:   . Sexually Abused:     I appreciate the opportunity to take part in Lacy's care. Please do not hesitate to contact me with questions.  Sincerely,   R. Edgar Frisk, MD

## 2019-11-13 NOTE — Assessment & Plan Note (Signed)
The history suggests that the cough is multifactorial with contribution from postnasal drainage and bronchial hyperresponsiveness.  Treatment plan as outlined above for moderate persistent asthma and allergic rhinitis.

## 2019-11-13 NOTE — Assessment & Plan Note (Signed)
   Continue appropriate allergen avoidance measures and carbinoxamine maleate as needed.  Given the history of epistaxis, discontinue Xhance nasal spray.  A prescription has been provided for azelastine nasal spray, 1-2 sprays per nostril 2 times daily as needed. Proper nasal spray technique has been discussed and demonstrated.   Nasal saline spray (i.e., Simply Saline) or nasal saline lavage (i.e., NeilMed) is recommended as needed and prior to medicated nasal sprays.  For thick post nasal drainage, add guaifenesin 1200 mg (Mucinex Maximum Strength)  twice daily as needed with adequate hydration as discussed.

## 2019-11-13 NOTE — Patient Instructions (Addendum)
Moderate persistent asthma Currently well controlled.  For now, continue Symbicort (budesonide/formoterol) 160/4.5 g, 2 inhalations via spacer device twice a day, montelukast 10 mg daily at bedtime and albuterol HFA, 1 to 2 inhalations every 4-6 hours if needed.  Subjective and objective measures of pulmonary function will be followed and the treatment plan will be adjusted accordingly.  Perennial allergic rhinitis  Continue appropriate allergen avoidance measures and carbinoxamine maleate as needed.  Given the history of epistaxis, discontinue Xhance nasal spray.  A prescription has been provided for azelastine nasal spray, 1-2 sprays per nostril 2 times daily as needed. Proper nasal spray technique has been discussed and demonstrated.   Nasal saline spray (i.e., Simply Saline) or nasal saline lavage (i.e., NeilMed) is recommended as needed and prior to medicated nasal sprays.  For thick post nasal drainage, add guaifenesin 1200 mg (Mucinex Maximum Strength)  twice daily as needed with adequate hydration as discussed.  Epistaxis  Proper technique for stanching epistaxis has been discussed and demonstrated.  Nasal saline spray and/or nasal saline gel is recommended to moisturize nasal mucosa.  The use of a cool-mist humidifier during the night is recommended.  During epistaxis, if needed, oxymetazoline (Afrin) nasal spray may be applied to a cotton ball to help stanch the blood flow.  If this problem persists or progresses, otolaryngology evaluation (Dr. Suszanne Conners) may be warranted.   Coughing The history suggests that the cough is multifactorial with contribution from postnasal drainage and bronchial hyperresponsiveness.  Treatment plan as outlined above for moderate persistent asthma and allergic rhinitis.   Return in about 4 months (around 03/14/2020), or if symptoms worsen or fail to improve.

## 2019-11-16 DIAGNOSIS — R6882 Decreased libido: Secondary | ICD-10-CM | POA: Diagnosis not present

## 2019-11-19 ENCOUNTER — Other Ambulatory Visit: Payer: Self-pay

## 2019-11-20 ENCOUNTER — Other Ambulatory Visit: Payer: Self-pay

## 2019-11-20 ENCOUNTER — Ambulatory Visit (INDEPENDENT_AMBULATORY_CARE_PROVIDER_SITE_OTHER): Payer: BC Managed Care – PPO | Admitting: Family Medicine

## 2019-11-20 ENCOUNTER — Encounter: Payer: Self-pay | Admitting: Family Medicine

## 2019-11-20 VITALS — BP 115/57 | HR 58 | Temp 98.4°F | Resp 12 | Ht 66.0 in | Wt 225.4 lb

## 2019-11-20 DIAGNOSIS — Z0000 Encounter for general adult medical examination without abnormal findings: Secondary | ICD-10-CM | POA: Diagnosis not present

## 2019-11-20 DIAGNOSIS — E2839 Other primary ovarian failure: Secondary | ICD-10-CM

## 2019-11-20 DIAGNOSIS — R7989 Other specified abnormal findings of blood chemistry: Secondary | ICD-10-CM | POA: Diagnosis not present

## 2019-11-20 DIAGNOSIS — D649 Anemia, unspecified: Secondary | ICD-10-CM | POA: Diagnosis not present

## 2019-11-20 DIAGNOSIS — J4540 Moderate persistent asthma, uncomplicated: Secondary | ICD-10-CM

## 2019-11-20 DIAGNOSIS — E669 Obesity, unspecified: Secondary | ICD-10-CM

## 2019-11-20 DIAGNOSIS — E785 Hyperlipidemia, unspecified: Secondary | ICD-10-CM | POA: Diagnosis not present

## 2019-11-20 DIAGNOSIS — F419 Anxiety disorder, unspecified: Secondary | ICD-10-CM

## 2019-11-20 DIAGNOSIS — Z780 Asymptomatic menopausal state: Secondary | ICD-10-CM

## 2019-11-20 NOTE — Assessment & Plan Note (Signed)
Is following with allergist and doing well

## 2019-11-20 NOTE — Assessment & Plan Note (Signed)
MIND diet or [facility] she will let us if she wants a referral

## 2019-11-20 NOTE — Assessment & Plan Note (Signed)
Hydrate and monitor 

## 2019-11-20 NOTE — Patient Instructions (Addendum)
Omron Blood Pressure cuff, upper arm, want BP 100-140/60-90 Pulse oximeter, want oxygen in 90s  Weekly vitals  Take Multivitamin with minerals, selenium Vitamin D 1000-2000 IU daily Probiotic with lactobacillus and bifidophilus Asprin EC 81 mg daily Krill or fish oil daily  https://garcia.net/ ToxicBlast.pl FEMA site at 4 [employer] for clean personal care products and cleaning products.   The mRNA technology has been in development for 20 years and we already had the Coronavirus family of viruses (which usually just cause the common cold) genetically mapped already which is why we were able to come up with viable vaccine candidates so quickly in stage 1, then stage 2 scientifically took the correct amount of time what we did to speed it up was just build the manufacturing platform at the same time we were running the experiments so if it worked we could produce faster. And stage 3 has now had many months and millions of people immunized and we are seeing the immunity hold for over 9 months now with sign of it dissipating and no significant numbers of adverse reactions.  During every flu season we see 2 anaphylactic reactions for every million shots given and we initially thought we would see 11 per million with the COVID vaccine but now we see only 2-3 with Moderna and 5 or so with [place] so compared to someone is dying every 20 minutes from [place] and more deadly and infectious strains are coming it is definitely best when weighing the risks and benefits to take the shots.  Another pooled analysis of the 5 most utilized vaccines in the world shows that after full immunization so far no one has died from [place].  Preventive Care 56-12 Years Old, Female Preventive care refers to visits with your health care provider and lifestyle choices that can promote health and wellness. This includes:  A yearly physical exam. This may also be  called an annual well check.  Regular dental visits and eye exams.  Immunizations.  Screening for certain conditions.  Healthy lifestyle choices, such as eating a healthy diet, getting regular exercise, not using drugs or products that contain nicotine and tobacco, and limiting alcohol use. What can I expect for my preventive care visit? Physical exam Your health care provider will check your:  Height and weight. This may be used to calculate body mass index (BMI), which tells if you are at a healthy weight.  Heart rate and blood pressure.  Skin for abnormal spots. Counseling Your health care provider may ask you questions about your:  Alcohol, tobacco, and drug use.  Emotional well-being.  Home and relationship well-being.  Sexual activity.  Eating habits.  Work and work Statistician.  Method of birth control.  Menstrual cycle.  Pregnancy history. What immunizations do I need?  Influenza (flu) vaccine  This is recommended every year. Tetanus, diphtheria, and pertussis (Tdap) vaccine  You may need a Td booster every 10 years. Varicella (chickenpox) vaccine  You may need this if you have not been vaccinated. Zoster (shingles) vaccine  You may need this after age 56. Measles, mumps, and rubella (MMR) vaccine  You may need at least one dose of MMR if you were born in 1957 or later. You may also need a second dose. Pneumococcal conjugate (PCV13) vaccine  You may need this if you have certain conditions and were not previously vaccinated. Pneumococcal polysaccharide (PPSV23) vaccine  You may need one or two doses if you smoke  cigarettes or if you have certain conditions. Meningococcal conjugate (MenACWY) vaccine  You may need this if you have certain conditions. Hepatitis A vaccine  You may need this if you have certain conditions or if you travel or work in places where you may be exposed to hepatitis A. Hepatitis B vaccine  You may need this if you  have certain conditions or if you travel or work in places where you may be exposed to hepatitis B. Haemophilus influenzae type b (Hib) vaccine  You may need this if you have certain conditions. Human papillomavirus (HPV) vaccine  If recommended by your health care provider, you may need three doses over 6 months. You may receive vaccines as individual doses or as more than one vaccine together in one shot (combination vaccines). Talk with your health care provider about the risks and benefits of combination vaccines. What tests do I need? Blood tests  Lipid and cholesterol levels. These may be checked every 5 years, or more frequently if you are over 56 years old.  Hepatitis C test.  Hepatitis B test. Screening  Lung cancer screening. You may have this screening every year starting at age 56 if you have a 30-pack-year history of smoking and currently smoke or have quit within the past 15 years.  Colorectal cancer screening. All adults should have this screening starting at age 56 and continuing until age 29. Your health care provider may recommend screening at age 56 if you are at increased risk. You will have tests every 1-10 years, depending on your results and the type of screening test.  Diabetes screening. This is done by checking your blood sugar (glucose) after you have not eaten for a while (fasting). You may have this done every 1-3 years.  Mammogram. This may be done every 1-2 years. Talk with your health care provider about when you should start having regular mammograms. This may depend on whether you have a family history of breast cancer.  BRCA-related cancer screening. This may be done if you have a family history of breast, ovarian, tubal, or peritoneal cancers.  Pelvic exam and Pap test. This may be done every 3 years starting at age 56. Starting at age 56, this may be done every 5 years if you have a Pap test in combination with an HPV test. Other tests  Sexually  transmitted disease (STD) testing.  Bone density scan. This is done to screen for osteoporosis. You may have this scan if you are at high risk for osteoporosis. Follow these instructions at home: Eating and drinking  Eat a diet that includes fresh fruits and vegetables, whole grains, lean protein, and low-fat dairy.  Take vitamin and mineral supplements as recommended by your health care provider.  Do not drink alcohol if: ? Your health care provider tells you not to drink. ? You are pregnant, may be pregnant, or are planning to become pregnant.  If you drink alcohol: ? Limit how much you have to 0-1 drink a day. ? Be aware of how much alcohol is in your drink. In the [place] one drink equals one 12 oz bottle of beer (355 mL), one 5 oz glass of wine (148 mL), or one 1 oz glass of hard liquor (44 mL). Lifestyle  Take daily care of your teeth and gums.  Stay active. Exercise for at least 30 minutes on 5 or more days each week.  Do not use any products that contain nicotine or tobacco, such as cigarettes, e-cigarettes, and  cigarettes, e-cigarettes, and chewing tobacco. If you need help quitting, ask your health care provider. °· If you are sexually active, practice safe sex. Use a condom or other form of birth control (contraception) in order to prevent pregnancy and STIs (sexually transmitted infections). °· If told by your health care provider, take low-dose aspirin daily starting at age 50. °What's next? °· Visit your health care provider once a year for a well check visit. °· Ask your health care provider how often you should have your eyes and teeth checked. °· Stay up to date on all vaccines. °This information is not intended to replace advice given to you by your health care provider. Make sure you discuss any questions you have with your health care provider. °Document Revised: 04/20/2018 Document Reviewed: 04/20/2018 °Elsevier Patient Education © 2020 Elsevier Inc. ° °

## 2019-11-20 NOTE — Assessment & Plan Note (Signed)
Tolerating statin, encouraged heart healthy diet, avoid trans fats, minimize simple carbs and saturated fats. Increase exercise as tolerated 

## 2019-11-20 NOTE — Assessment & Plan Note (Signed)
She has a very stressful job and is considering quitting her job

## 2019-11-20 NOTE — Assessment & Plan Note (Signed)
Patient encouraged to maintain heart healthy diet, regular exercise, adequate sleep. Consider daily probiotics. Take medications as prescribed. Labs ordered and reviewed. Had a [facility] 10/15/2019 normal. Is seeing a personal trainer and exercising several times a week. Colonoscopy was done on 09/24/2015 repeat in 10 years.

## 2019-11-20 NOTE — Assessment & Plan Note (Signed)
dexa scan ordered. Family history strong for osteoporosis

## 2019-11-21 LAB — COMPREHENSIVE METABOLIC PANEL
ALT: 24 U/L (ref 0–35)
AST: 20 U/L (ref 0–37)
Albumin: 4.3 g/dL (ref 3.5–5.2)
Alkaline Phosphatase: 59 U/L (ref 39–117)
BUN: 21 mg/dL (ref 6–23)
CO2: 30 mEq/L (ref 19–32)
Calcium: 9.6 mg/dL (ref 8.4–10.5)
Chloride: 104 mEq/L (ref 96–112)
Creatinine, Ser: 0.9 mg/dL (ref 0.40–1.20)
GFR: 64.93 mL/min (ref >60.00)
Glucose, Bld: 83 mg/dL (ref 70–99)
Potassium: 4.5 mEq/L (ref 3.5–5.1)
Sodium: 141 mEq/L (ref 135–145)
Total Bilirubin: 0.9 mg/dL (ref 0.2–1.2)
Total Protein: 6.6 g/dL (ref 6.0–8.3)

## 2019-11-21 LAB — LIPID PANEL
Cholesterol: 157 mg/dL (ref 0–200)
HDL: 64.9 mg/dL (ref >39.00)
LDL Cholesterol: 78 mg/dL (ref 0–99)
NonHDL: 91.72
Total CHOL/HDL Ratio: 2
Triglycerides: 67 mg/dL (ref 0.0–149.0)
VLDL: 13.4 mg/dL (ref 0.0–40.0)

## 2019-11-21 LAB — CBC
HCT: 41.1 % (ref 36.0–46.0)
Hemoglobin: 13.8 g/dL (ref 12.0–15.0)
MCHC: 33.5 g/dL (ref 30.0–36.0)
MCV: 90.5 fl (ref 78.0–100.0)
Platelets: 204 10*3/uL (ref 150.0–400.0)
RBC: 4.54 Mil/uL (ref 3.87–5.11)
RDW: 12.4 % (ref 11.5–15.5)
WBC: 9 10*3/uL (ref 4.0–10.5)

## 2019-11-21 LAB — TSH: TSH: 2.47 u[IU]/mL (ref 0.35–4.50)

## 2019-11-21 NOTE — Progress Notes (Signed)
Subjective:    Patient ID: Jessica Bradford, female    DOB: Jan 16, 1966, 54 y.o.   MRN: [id number]  Chief Complaint  Patient presents with  . Annual Exam    HPI Patient is in today for annual preventative exam. No recent febrile illness or hospitalizations. She has been doing a very stressful job since the beginning of the pandemic. She is frustrated with weight gain. Has been maintaining quarantine when not working. Denies CP/palp/SOB/HA/congestion/fevers/GI or GU c/o. Taking meds as prescribed  Past Medical History:  Diagnosis Date  . Anemia   . Anxiety   . Heart palpitations   . History of chicken pox   . Hyperlipidemia   . Incomplete right bundle branch block   . Mitral valve prolapse   . Urinary incontinence     Past Surgical History:  Procedure Laterality Date  . CHOLECYSTECTOMY    . TUBAL LIGATION  1996  . TUBAL LIGATION      Family History  Problem Relation Age of Onset  . Stroke Father   . Hypertension Father   . Alcohol abuse Father   . Heart attack Mother   . Alcohol abuse Sister   . Alcohol abuse Brother   . Drug abuse Brother        Heroin  . Heart attack Brother   . Alcohol abuse Brother   . Drug abuse Brother        THC  . Heart disease Maternal Grandmother   . Heart disease Maternal Grandfather   . Hypertension Paternal Grandmother   . Dementia Paternal Grandmother   . Cancer Paternal Grandfather 81       lung, smoker  . Other Paternal Grandfather        noncancerous tumor    Social History   Socioeconomic History  . Marital status: Married    Spouse name: Not on file  . Number of children: Not on file  . Years of education: Not on file  . Highest education level: Not on file  Occupational History  . Not on file  Tobacco Use  . Smoking status: Never Smoker  . Smokeless tobacco: Never Used  Substance and Sexual Activity  . Alcohol use: Yes    Alcohol/week: 2.0 standard drinks    Types: 2 Cans of beer per week  . Drug use: No    . Sexual activity: Yes    Birth control/protection: Surgical    Comment: Tubal  Other Topics Concern  . Not on file  Social History Narrative   Works for [employer], no major dietary restrictions, lives with husband and their dog   Social Determinants of Health   Financial Resource Strain:   . Difficulty of Paying Living Expenses:   Food Insecurity:   . Worried About Programme researcher, broadcasting/film/video in the Last Year:   . Barista in the Last Year:   Transportation Needs:   . Freight forwarder (Medical):   Marland Kitchen Lack of Transportation (Non-Medical):   Physical Activity:   . Days of Exercise per Week:   . Minutes of Exercise per Session:   Stress:   . Feeling of Stress :   Social Connections:   . Frequency of Communication with Friends and Family:   . Frequency of Social Gatherings with Friends and Family:   . Attends Religious Services:   . Active Member of Clubs or Organizations:   . Attends Banker Meetings:   Marland Kitchen Marital Status:   Intimate Programme researcher, broadcasting/film/video  Violence:   . Fear of Current or Ex-Partner:   . Emotionally Abused:   Marland Kitchen Physically Abused:   . Sexually Abused:     Outpatient Medications Prior to Visit  Medication Sig Dispense Refill  . ALPRAZolam (XANAX) 0.25 MG tablet Take 0.5 tablets by mouth daily as needed.  1  . aspirin 81 MG tablet Take 81 mg by mouth daily.      Marland Kitchen atorvastatin (LIPITOR) 80 MG tablet TAKE 1 TABLET BY MOUTH EVERY DAY 90 tablet 3  . azelastine (ASTELIN) 0.1 % nasal spray Place 1-2 sprays into both nostrils 2 (two) times daily as needed for rhinitis. Use in each nostril as directed 30 mL 12  . budesonide-formoterol (SYMBICORT) 160-4.5 MCG/ACT inhaler Inhale 2 puffs into the lungs 2 (two) times daily.    . Carbinoxamine Maleate 4 MG TABS Take 1 tablet (4 mg total) by mouth every 6 (six) hours as needed. 28 each 5  . famotidine (PEPCID) 20 MG tablet TAKE 1 TABLET BY MOUTH TWICE A DAY 50 tablet 2  . levocetirizine (XYZAL) 5 MG tablet Take 5 mg by mouth  every evening.    . montelukast (SINGULAIR) 10 MG tablet TAKE 1 TABLET BY MOUTH AT BEDTIME GENERIC EQUIVALENT FOR SINGULAIR. APPOINTMENT 07/10/19 90 tablet 1  . multivitamin (THERAGRAN) per tablet Take 1 tablet by mouth daily.      Marland Kitchen oxybutynin (DITROPAN XL) 15 MG 24 hr tablet Take 1 tablet by mouth daily.  3  . PAZEO 0.7 % SOLN PLACE 1 DROP INTO BOTH EYES DAILY AS NEEDED. 2.5 mL 1  . PROAIR HFA 108 (90 Base) MCG/ACT inhaler TAKE 2 PUFFS BY MOUTH EVERY 6 HOURS AS NEEDED FOR WHEEZE OR SHORTNESS OF BREATH 8.5 g 2  . sertraline (ZOLOFT) 50 MG tablet Take 1 tablet by mouth daily.  2  . budesonide-formoterol (SYMBICORT) 160-4.5 MCG/ACT inhaler Inhale 2 puffs into the lungs 2 (two) times daily. 1 Inhaler 5  . clindamycin (CLEOCIN) 300 MG capsule Take 300 mg by mouth every 6 (six) hours.    . diclofenac Sodium (VOLTAREN) 1 % GEL diclofenac 1 % topical gel  APPLY 2 GRAMS TO THE AFFECTED AREA(S) BY TOPICAL ROUTE 4 TIMES PER DAY    . mupirocin ointment (BACTROBAN) 2 % APPLY TO AFFECTED AREA 3 TIMES A DAY FOR 7 DAYS    . SYMBICORT 80-4.5 MCG/ACT inhaler TAKE 2 PUFFS BY MOUTH TWICE A DAY (Patient not taking: Reported on 11/13/2019) 30.6 Inhaler 4  . valACYclovir (VALTREX) 500 MG tablet Take 500 mg by mouth daily as needed (fever blisters).     [name] 93 MCG/ACT EXHU BLOW TWO DOSES IN EACH NOSTRIL TWICE DAILY AS DIRECTED. 32 mL 3   No facility-administered medications prior to visit.    Allergies  Allergen Reactions  . Sulfa Antibiotics Rash  . Doxycycline Rash  . Sulfur Rash    Review of Systems  Constitutional: Negative for chills, fever and malaise/fatigue.  HENT: Negative for congestion and hearing loss.   Eyes: Negative for discharge.  Respiratory: Negative for cough, sputum production and shortness of breath.   Cardiovascular: Negative for chest pain, palpitations and leg swelling.  Gastrointestinal: Negative for abdominal pain, blood in stool, constipation, diarrhea, heartburn, nausea and  vomiting.  Genitourinary: Negative for dysuria, frequency, hematuria and urgency.  Musculoskeletal: Negative for back pain, falls and myalgias.  Skin: Negative for rash.  Neurological: Negative for dizziness, sensory change, loss of consciousness, weakness and headaches.  Endo/Heme/Allergies: Negative for environmental  allergies. Does not bruise/bleed easily.  Psychiatric/Behavioral: Positive for depression. Negative for suicidal ideas. The patient is nervous/anxious. The patient does not have insomnia.        Objective:    Physical Exam Constitutional:      General: She is not in acute distress.    Appearance: She is not diaphoretic.  HENT:     Head: Normocephalic and atraumatic.     Right Ear: External ear normal.     Left Ear: External ear normal.     Nose: Nose normal.     Mouth/Throat:     Pharynx: No oropharyngeal exudate.  Eyes:     General: No scleral icterus.       Right eye: No discharge.        Left eye: No discharge.     Conjunctiva/sclera: Conjunctivae normal.     Pupils: Pupils are equal, round, and reactive to light.  Neck:     Thyroid: No thyromegaly.  Cardiovascular:     Rate and Rhythm: Normal rate and regular rhythm.     Heart sounds: Normal heart sounds. No murmur.  Pulmonary:     Effort: Pulmonary effort is normal. No respiratory distress.     Breath sounds: Normal breath sounds. No wheezing or rales.  Abdominal:     General: Bowel sounds are normal. There is no distension.     Palpations: Abdomen is soft. There is no mass.     Tenderness: There is no abdominal tenderness.  Musculoskeletal:        General: No tenderness. Normal range of motion.     Cervical back: Normal range of motion and neck supple.  Lymphadenopathy:     Cervical: No cervical adenopathy.  Skin:    General: Skin is warm and dry.     Findings: No rash.  Neurological:     Mental Status: She is alert and oriented to person, place, and time.     Cranial Nerves: No cranial nerve  deficit.     Coordination: Coordination normal.     Deep Tendon Reflexes: Reflexes are normal and symmetric. Reflexes normal.     BP (!) 115/57 (BP Location: Right Arm, Cuff Size: Normal)   Pulse (!) 58   Temp 98.4 F (36.9 C) (Temporal)   Resp 12   Ht 5\' 6"  (1.676 m)   Wt 225 lb 6.4 oz (102.2 kg)   SpO2 100%   BMI 36.38 kg/m  Wt Readings from Last 3 Encounters:  11/20/19 225 lb 6.4 oz (102.2 kg)  11/13/19 226 lb 12.8 oz (102.9 kg)  05/23/19 215 lb (97.5 kg)    Diabetic Foot Exam - Simple   No data filed     Lab Results  Component Value Date   WBC 9.0 11/20/2019   HGB 13.8 11/20/2019   HCT 41.1 11/20/2019   PLT 204.0 11/20/2019   GLUCOSE 83 11/20/2019   CHOL 157 11/20/2019   TRIG 67.0 11/20/2019   HDL 64.90 11/20/2019   LDLCALC 78 11/20/2019   ALT 24 11/20/2019   AST 20 11/20/2019   NA 141 11/20/2019   K 4.5 11/20/2019   CL 104 11/20/2019   CREATININE 0.90 11/20/2019   BUN 21 11/20/2019   CO2 30 11/20/2019   TSH 2.47 11/20/2019    Lab Results  Component Value Date   TSH 2.47 11/20/2019   Lab Results  Component Value Date   WBC 9.0 11/20/2019   HGB 13.8 11/20/2019   HCT 41.1 11/20/2019   MCV  90.5 11/20/2019   PLT 204.0 11/20/2019   Lab Results  Component Value Date   NA 141 11/20/2019   K 4.5 11/20/2019   CO2 30 11/20/2019   GLUCOSE 83 11/20/2019   BUN 21 11/20/2019   CREATININE 0.90 11/20/2019   BILITOT 0.9 11/20/2019   ALKPHOS 59 11/20/2019   AST 20 11/20/2019   ALT 24 11/20/2019   PROT 6.6 11/20/2019   ALBUMIN 4.3 11/20/2019   CALCIUM 9.6 11/20/2019   GFR 64.93 11/20/2019   Lab Results  Component Value Date   CHOL 157 11/20/2019   Lab Results  Component Value Date   HDL 64.90 11/20/2019   Lab Results  Component Value Date   LDLCALC 78 11/20/2019   Lab Results  Component Value Date   TRIG 67.0 11/20/2019   Lab Results  Component Value Date   CHOLHDL 2 11/20/2019   No results found for: HGBA1C     Assessment & Plan:     Problem List Items Addressed This Visit    Hyperlipidemia - Primary    Tolerating statin, encouraged heart healthy diet, avoid trans fats, minimize simple carbs and saturated fats. Increase exercise as tolerated      Relevant Orders   Lipid panel (Completed)   Elevated serum creatinine    Hydrate and monitor      Obesity    MIND diet or [facility] she will let us if she wants a referral      Anemia   Preventative health care    Patient encouraged to maintain heart healthy diet, regular exercise, adequate sleep. Consider daily probiotics. Take medications as prescribed. Labs ordered and reviewed. Had a [facility] 10/15/2019 normal. Is seeing a personal trainer and exercising several times a week. Colonoscopy was done on 09/24/2015 repeat in 10 years.       Relevant Orders   CBC (Completed)   Comprehensive metabolic panel (Completed)   TSH (Completed)   Anxiety    She has a very stressful job and is considering quitting her job       Moderate persistent asthma    Is following with allergist and doing well      Relevant Medications   budesonide-formoterol (SYMBICORT) 160-4.5 MCG/ACT inhaler   Estrogen deficiency    dexa scan ordered. Family history strong for osteoporosis      Relevant Orders   DG Bone Density    Other Visit Diagnoses    Post-menopausal       Relevant Orders   DG Bone Density      I have discontinued Irina L. Dubie's valACYclovir, Xhance, clindamycin, diclofenac Sodium, mupirocin ointment, and Symbicort. I am also having her maintain her multivitamin, aspirin, sertraline, oxybutynin, ALPRAZolam, levocetirizine, Carbinoxamine Maleate, atorvastatin, ProAir HFA, Pazeo, montelukast, famotidine, azelastine, and budesonide-formoterol.  No orders of the defined types were placed in this encounter.    Penni Homans, MD

## 2019-11-23 ENCOUNTER — Other Ambulatory Visit: Payer: Self-pay

## 2019-11-23 ENCOUNTER — Ambulatory Visit (HOSPITAL_BASED_OUTPATIENT_CLINIC_OR_DEPARTMENT_OTHER)
Admission: RE | Admit: 2019-11-23 | Discharge: 2019-11-23 | Disposition: A | Discharge status: Home / Self Care | Payer: BC Managed Care – PPO | Source: Ambulatory Visit | Attending: Family Medicine | Admitting: Family Medicine

## 2019-11-23 DIAGNOSIS — Z780 Asymptomatic menopausal state: Secondary | ICD-10-CM | POA: Insufficient documentation

## 2019-11-23 DIAGNOSIS — E2839 Other primary ovarian failure: Secondary | ICD-10-CM | POA: Insufficient documentation

## 2019-11-26 ENCOUNTER — Encounter: Payer: Self-pay | Admitting: Family Medicine

## 2019-12-05 DIAGNOSIS — M9901 Segmental and somatic dysfunction of cervical region: Secondary | ICD-10-CM | POA: Diagnosis not present

## 2019-12-05 DIAGNOSIS — M9903 Segmental and somatic dysfunction of lumbar region: Secondary | ICD-10-CM | POA: Diagnosis not present

## 2019-12-05 DIAGNOSIS — M5136 Other intervertebral disc degeneration, lumbar region: Secondary | ICD-10-CM | POA: Diagnosis not present

## 2019-12-05 DIAGNOSIS — M50322 Other cervical disc degeneration at C5-C6 level: Secondary | ICD-10-CM | POA: Diagnosis not present

## 2019-12-06 DIAGNOSIS — M50322 Other cervical disc degeneration at C5-C6 level: Secondary | ICD-10-CM | POA: Diagnosis not present

## 2019-12-06 DIAGNOSIS — M9901 Segmental and somatic dysfunction of cervical region: Secondary | ICD-10-CM | POA: Diagnosis not present

## 2019-12-06 DIAGNOSIS — M9903 Segmental and somatic dysfunction of lumbar region: Secondary | ICD-10-CM | POA: Diagnosis not present

## 2019-12-06 DIAGNOSIS — M5136 Other intervertebral disc degeneration, lumbar region: Secondary | ICD-10-CM | POA: Diagnosis not present

## 2019-12-10 DIAGNOSIS — M50322 Other cervical disc degeneration at C5-C6 level: Secondary | ICD-10-CM | POA: Diagnosis not present

## 2019-12-10 DIAGNOSIS — M9903 Segmental and somatic dysfunction of lumbar region: Secondary | ICD-10-CM | POA: Diagnosis not present

## 2019-12-10 DIAGNOSIS — M5136 Other intervertebral disc degeneration, lumbar region: Secondary | ICD-10-CM | POA: Diagnosis not present

## 2019-12-10 DIAGNOSIS — M9901 Segmental and somatic dysfunction of cervical region: Secondary | ICD-10-CM | POA: Diagnosis not present

## 2019-12-11 DIAGNOSIS — M9901 Segmental and somatic dysfunction of cervical region: Secondary | ICD-10-CM | POA: Diagnosis not present

## 2019-12-11 DIAGNOSIS — M50322 Other cervical disc degeneration at C5-C6 level: Secondary | ICD-10-CM | POA: Diagnosis not present

## 2019-12-11 DIAGNOSIS — M5136 Other intervertebral disc degeneration, lumbar region: Secondary | ICD-10-CM | POA: Diagnosis not present

## 2019-12-11 DIAGNOSIS — M9903 Segmental and somatic dysfunction of lumbar region: Secondary | ICD-10-CM | POA: Diagnosis not present

## 2019-12-13 DIAGNOSIS — M5136 Other intervertebral disc degeneration, lumbar region: Secondary | ICD-10-CM | POA: Diagnosis not present

## 2019-12-13 DIAGNOSIS — M50322 Other cervical disc degeneration at C5-C6 level: Secondary | ICD-10-CM | POA: Diagnosis not present

## 2019-12-13 DIAGNOSIS — M9903 Segmental and somatic dysfunction of lumbar region: Secondary | ICD-10-CM | POA: Diagnosis not present

## 2019-12-13 DIAGNOSIS — M9901 Segmental and somatic dysfunction of cervical region: Secondary | ICD-10-CM | POA: Diagnosis not present

## 2019-12-17 DIAGNOSIS — M9901 Segmental and somatic dysfunction of cervical region: Secondary | ICD-10-CM | POA: Diagnosis not present

## 2019-12-17 DIAGNOSIS — M5136 Other intervertebral disc degeneration, lumbar region: Secondary | ICD-10-CM | POA: Diagnosis not present

## 2019-12-17 DIAGNOSIS — M50322 Other cervical disc degeneration at C5-C6 level: Secondary | ICD-10-CM | POA: Diagnosis not present

## 2019-12-17 DIAGNOSIS — M9903 Segmental and somatic dysfunction of lumbar region: Secondary | ICD-10-CM | POA: Diagnosis not present

## 2019-12-18 DIAGNOSIS — M9903 Segmental and somatic dysfunction of lumbar region: Secondary | ICD-10-CM | POA: Diagnosis not present

## 2019-12-18 DIAGNOSIS — M5136 Other intervertebral disc degeneration, lumbar region: Secondary | ICD-10-CM | POA: Diagnosis not present

## 2019-12-18 DIAGNOSIS — M50322 Other cervical disc degeneration at C5-C6 level: Secondary | ICD-10-CM | POA: Diagnosis not present

## 2019-12-18 DIAGNOSIS — M9901 Segmental and somatic dysfunction of cervical region: Secondary | ICD-10-CM | POA: Diagnosis not present

## 2019-12-20 DIAGNOSIS — M5136 Other intervertebral disc degeneration, lumbar region: Secondary | ICD-10-CM | POA: Diagnosis not present

## 2019-12-20 DIAGNOSIS — M9903 Segmental and somatic dysfunction of lumbar region: Secondary | ICD-10-CM | POA: Diagnosis not present

## 2019-12-20 DIAGNOSIS — M9901 Segmental and somatic dysfunction of cervical region: Secondary | ICD-10-CM | POA: Diagnosis not present

## 2019-12-20 DIAGNOSIS — M50322 Other cervical disc degeneration at C5-C6 level: Secondary | ICD-10-CM | POA: Diagnosis not present

## 2019-12-23 ENCOUNTER — Other Ambulatory Visit: Payer: Self-pay | Admitting: Allergy and Immunology

## 2019-12-25 DIAGNOSIS — M9903 Segmental and somatic dysfunction of lumbar region: Secondary | ICD-10-CM | POA: Diagnosis not present

## 2019-12-25 DIAGNOSIS — M50322 Other cervical disc degeneration at C5-C6 level: Secondary | ICD-10-CM | POA: Diagnosis not present

## 2019-12-25 DIAGNOSIS — M9901 Segmental and somatic dysfunction of cervical region: Secondary | ICD-10-CM | POA: Diagnosis not present

## 2019-12-25 DIAGNOSIS — M5136 Other intervertebral disc degeneration, lumbar region: Secondary | ICD-10-CM | POA: Diagnosis not present

## 2019-12-27 DIAGNOSIS — M5136 Other intervertebral disc degeneration, lumbar region: Secondary | ICD-10-CM | POA: Diagnosis not present

## 2019-12-27 DIAGNOSIS — M50322 Other cervical disc degeneration at C5-C6 level: Secondary | ICD-10-CM | POA: Diagnosis not present

## 2019-12-27 DIAGNOSIS — M9903 Segmental and somatic dysfunction of lumbar region: Secondary | ICD-10-CM | POA: Diagnosis not present

## 2019-12-27 DIAGNOSIS — M9901 Segmental and somatic dysfunction of cervical region: Secondary | ICD-10-CM | POA: Diagnosis not present

## 2019-12-31 ENCOUNTER — Telehealth: Payer: Self-pay

## 2019-12-31 MED ORDER — FLUTICASONE PROPIONATE 50 MCG/ACT NA SUSP: 2.0000 | Freq: Every day | NASAL | 5 refills | Status: DC

## 2019-12-31 NOTE — Telephone Encounter (Signed)
Fluticasone 2 spray per nostril put into the pharmacy

## 2020-01-01 IMAGING — DX DG CHEST 2V
2 series · 2 of 2 positions shown · non-contrast
Comparison: None.

CLINICAL DATA: Chronic cough for 4 months.

EXAM:
CHEST - 2 VIEW

[chest pa]
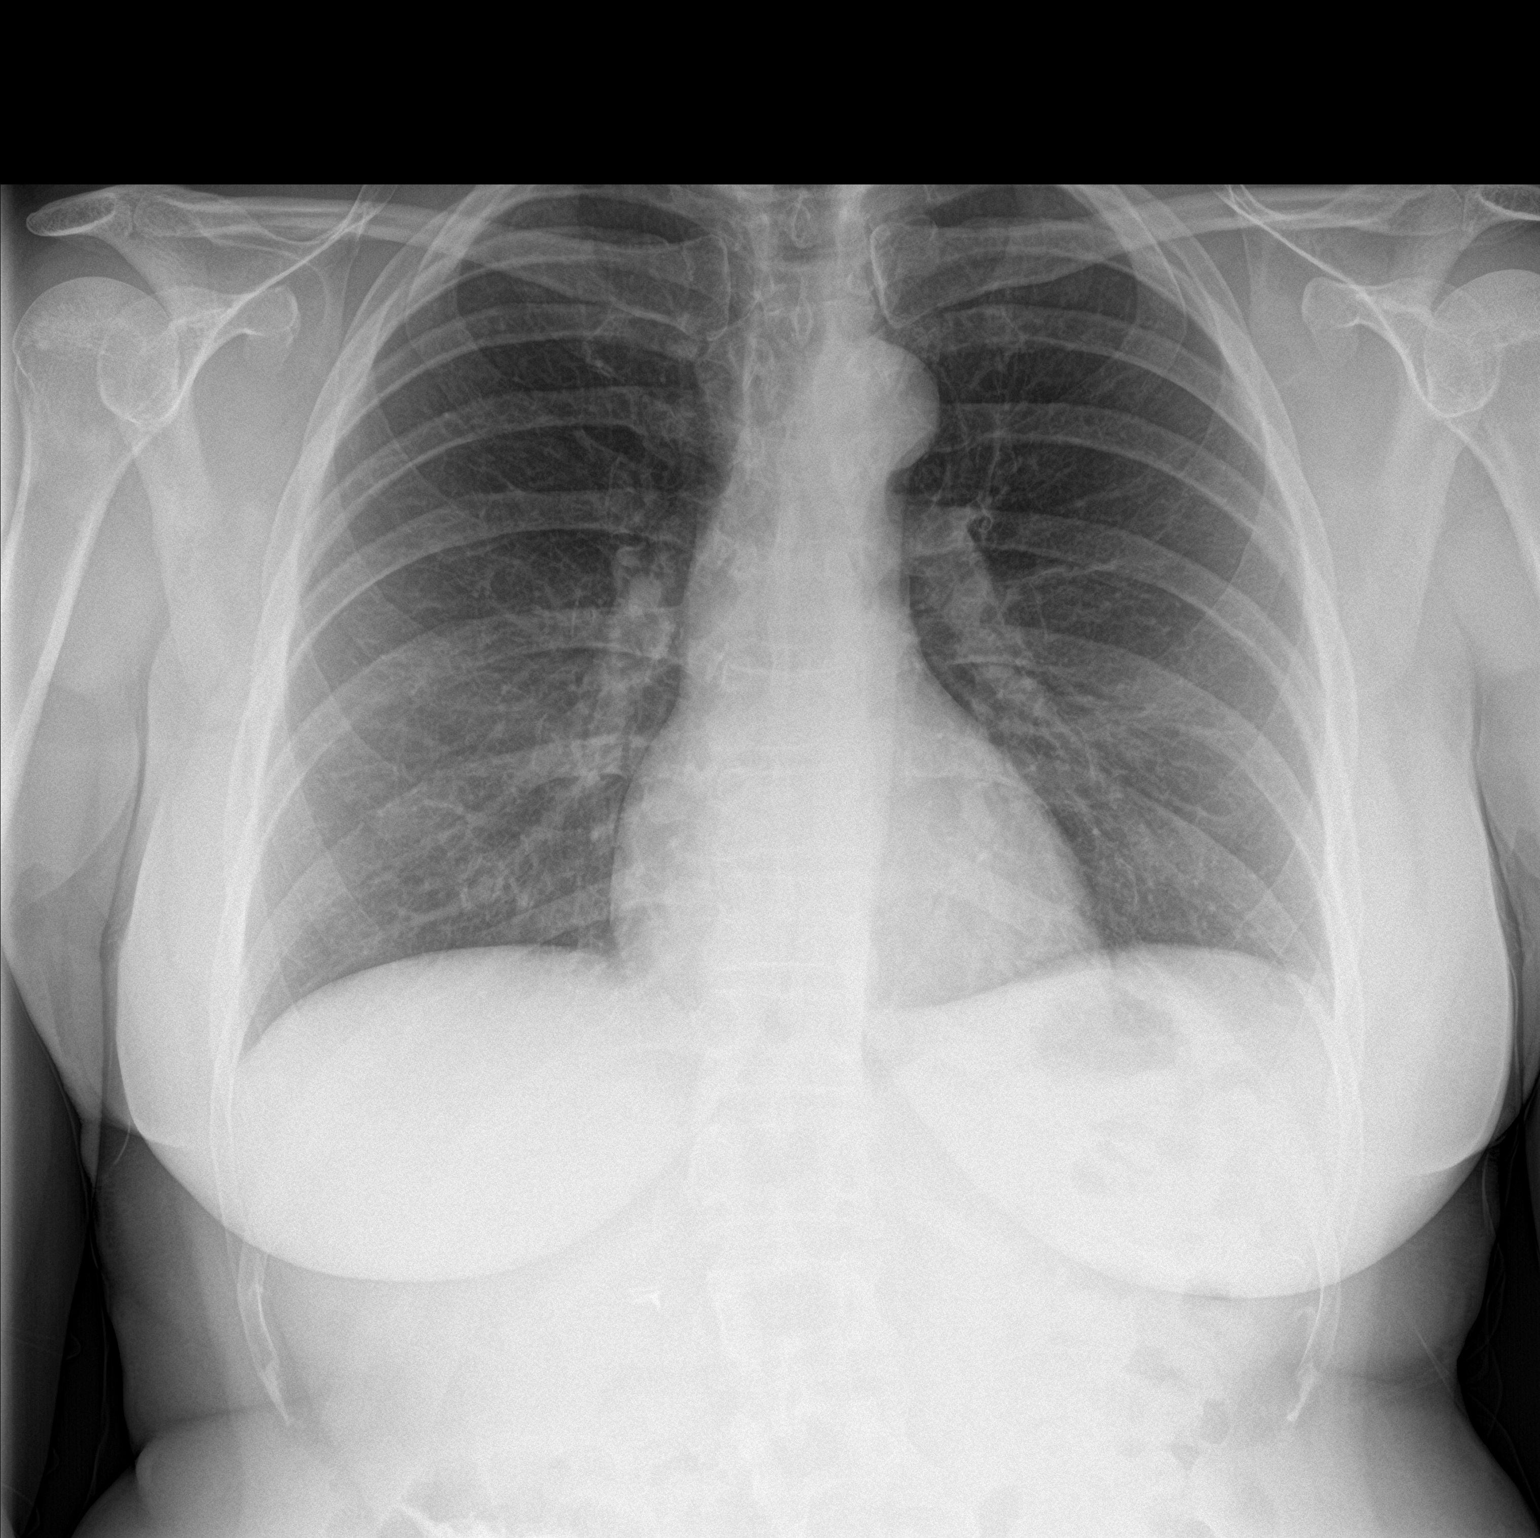

[chest lat]
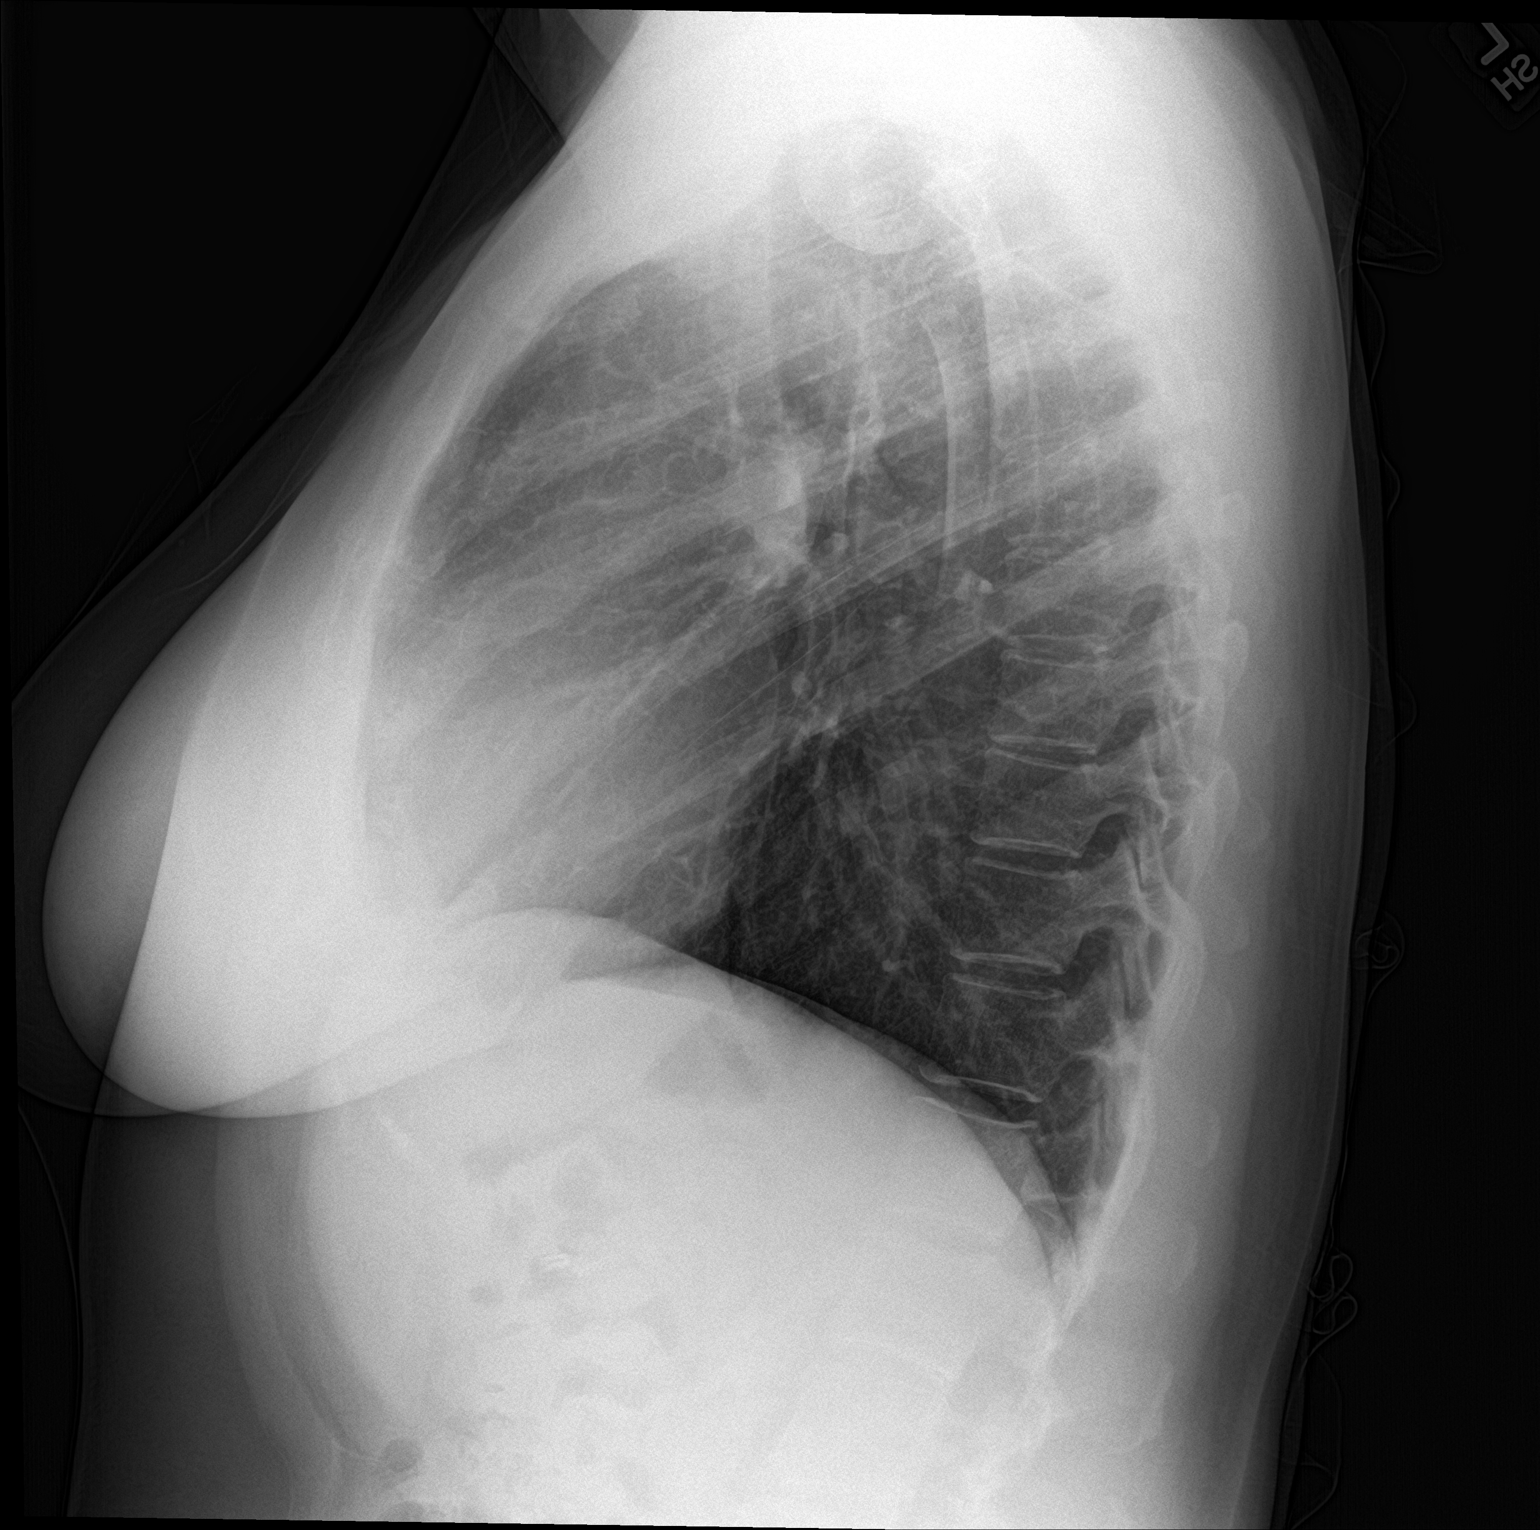

[2 of 2 positions shown; findings below may reference images not displayed]

FINDINGS: The cardiomediastinal silhouette is within normal limits. There is
minimal pleural-parenchymal scarring in the lung apices. The lungs
are otherwise clear. No pleural effusion or pneumothorax is
identified. Right upper quadrant abdominal surgical clips and mild
thoracolumbar dextroscoliosis are noted.
IMPRESSION: No active cardiopulmonary disease.

## 2020-01-08 DIAGNOSIS — M9903 Segmental and somatic dysfunction of lumbar region: Secondary | ICD-10-CM | POA: Diagnosis not present

## 2020-01-08 DIAGNOSIS — M50322 Other cervical disc degeneration at C5-C6 level: Secondary | ICD-10-CM | POA: Diagnosis not present

## 2020-01-08 DIAGNOSIS — M5136 Other intervertebral disc degeneration, lumbar region: Secondary | ICD-10-CM | POA: Diagnosis not present

## 2020-01-08 DIAGNOSIS — M9901 Segmental and somatic dysfunction of cervical region: Secondary | ICD-10-CM | POA: Diagnosis not present

## 2020-01-13 ENCOUNTER — Other Ambulatory Visit: Payer: Self-pay | Admitting: Family Medicine

## 2020-01-16 ENCOUNTER — Telehealth: Payer: Self-pay

## 2020-01-16 DIAGNOSIS — M9903 Segmental and somatic dysfunction of lumbar region: Secondary | ICD-10-CM | POA: Diagnosis not present

## 2020-01-16 DIAGNOSIS — M9901 Segmental and somatic dysfunction of cervical region: Secondary | ICD-10-CM | POA: Diagnosis not present

## 2020-01-16 DIAGNOSIS — M50322 Other cervical disc degeneration at C5-C6 level: Secondary | ICD-10-CM | POA: Diagnosis not present

## 2020-01-16 DIAGNOSIS — M5136 Other intervertebral disc degeneration, lumbar region: Secondary | ICD-10-CM | POA: Diagnosis not present

## 2020-01-16 NOTE — Telephone Encounter (Signed)
Printed out form letter for [employer].  Left on Dr. Maren Reamer desk for review.

## 2020-01-16 NOTE — Telephone Encounter (Signed)
Good afternoon Dr. Nunzio Cobbs,   My employer has put a notice saying we must wear out mask all day if we have not been vaccinated.  I have hard time wearing my mask for long periods of time.  I feel like I can really get a deep breathe after having it on for extended periods.   I can request accommodations for medical reasons, can you provide me with a medical note.  As at my last visit I have not been vaccinated and I still not comfortable with being vaccinated at this time.  Maybe later with more research and FDC APPROVED. Thank you, Ellsie Hellmer

## 2020-01-16 NOTE — Telephone Encounter (Signed)
Yes, please write the note (I believe Whitney Post made a template) and I will review and sign it. Thanks.

## 2020-01-16 NOTE — Telephone Encounter (Addendum)
Dr. Nunzio Cobbs signed letter.  Left message on patient voice mail that letter was ready and at front desk for patient to pick up (in [facility]), or we can mail to patients home as patient is usually seen in [facility].  Informed in message that if we do not hear from patient by end of day, we will mail letter to patient home address listed in Epic.

## 2020-01-18 DIAGNOSIS — R6882 Decreased libido: Secondary | ICD-10-CM | POA: Diagnosis not present

## 2020-01-22 DIAGNOSIS — M5136 Other intervertebral disc degeneration, lumbar region: Secondary | ICD-10-CM | POA: Diagnosis not present

## 2020-01-22 DIAGNOSIS — M50322 Other cervical disc degeneration at C5-C6 level: Secondary | ICD-10-CM | POA: Diagnosis not present

## 2020-01-22 DIAGNOSIS — M9903 Segmental and somatic dysfunction of lumbar region: Secondary | ICD-10-CM | POA: Diagnosis not present

## 2020-01-22 DIAGNOSIS — M9901 Segmental and somatic dysfunction of cervical region: Secondary | ICD-10-CM | POA: Diagnosis not present

## 2020-01-24 DIAGNOSIS — D225 Melanocytic nevi of trunk: Secondary | ICD-10-CM | POA: Diagnosis not present

## 2020-01-24 DIAGNOSIS — L918 Other hypertrophic disorders of the skin: Secondary | ICD-10-CM | POA: Diagnosis not present

## 2020-01-24 DIAGNOSIS — D692 Other nonthrombocytopenic purpura: Secondary | ICD-10-CM | POA: Diagnosis not present

## 2020-01-24 DIAGNOSIS — L814 Other melanin hyperpigmentation: Secondary | ICD-10-CM | POA: Diagnosis not present

## 2020-01-24 DIAGNOSIS — D2262 Melanocytic nevi of left upper limb, including shoulder: Secondary | ICD-10-CM | POA: Diagnosis not present

## 2020-01-29 DIAGNOSIS — M50322 Other cervical disc degeneration at C5-C6 level: Secondary | ICD-10-CM | POA: Diagnosis not present

## 2020-01-29 DIAGNOSIS — M5136 Other intervertebral disc degeneration, lumbar region: Secondary | ICD-10-CM | POA: Diagnosis not present

## 2020-01-29 DIAGNOSIS — M9903 Segmental and somatic dysfunction of lumbar region: Secondary | ICD-10-CM | POA: Diagnosis not present

## 2020-01-29 DIAGNOSIS — M9901 Segmental and somatic dysfunction of cervical region: Secondary | ICD-10-CM | POA: Diagnosis not present

## 2020-01-30 ENCOUNTER — Other Ambulatory Visit: Payer: Self-pay | Admitting: Allergy and Immunology

## 2020-02-04 IMAGING — DX DG CHEST 2V
2 series · 2 of 2 positions shown · non-contrast
Comparison: 07/25/2018

CLINICAL DATA: Persistent cough for several months

EXAM:
CHEST - 2 VIEW

[chest pa]
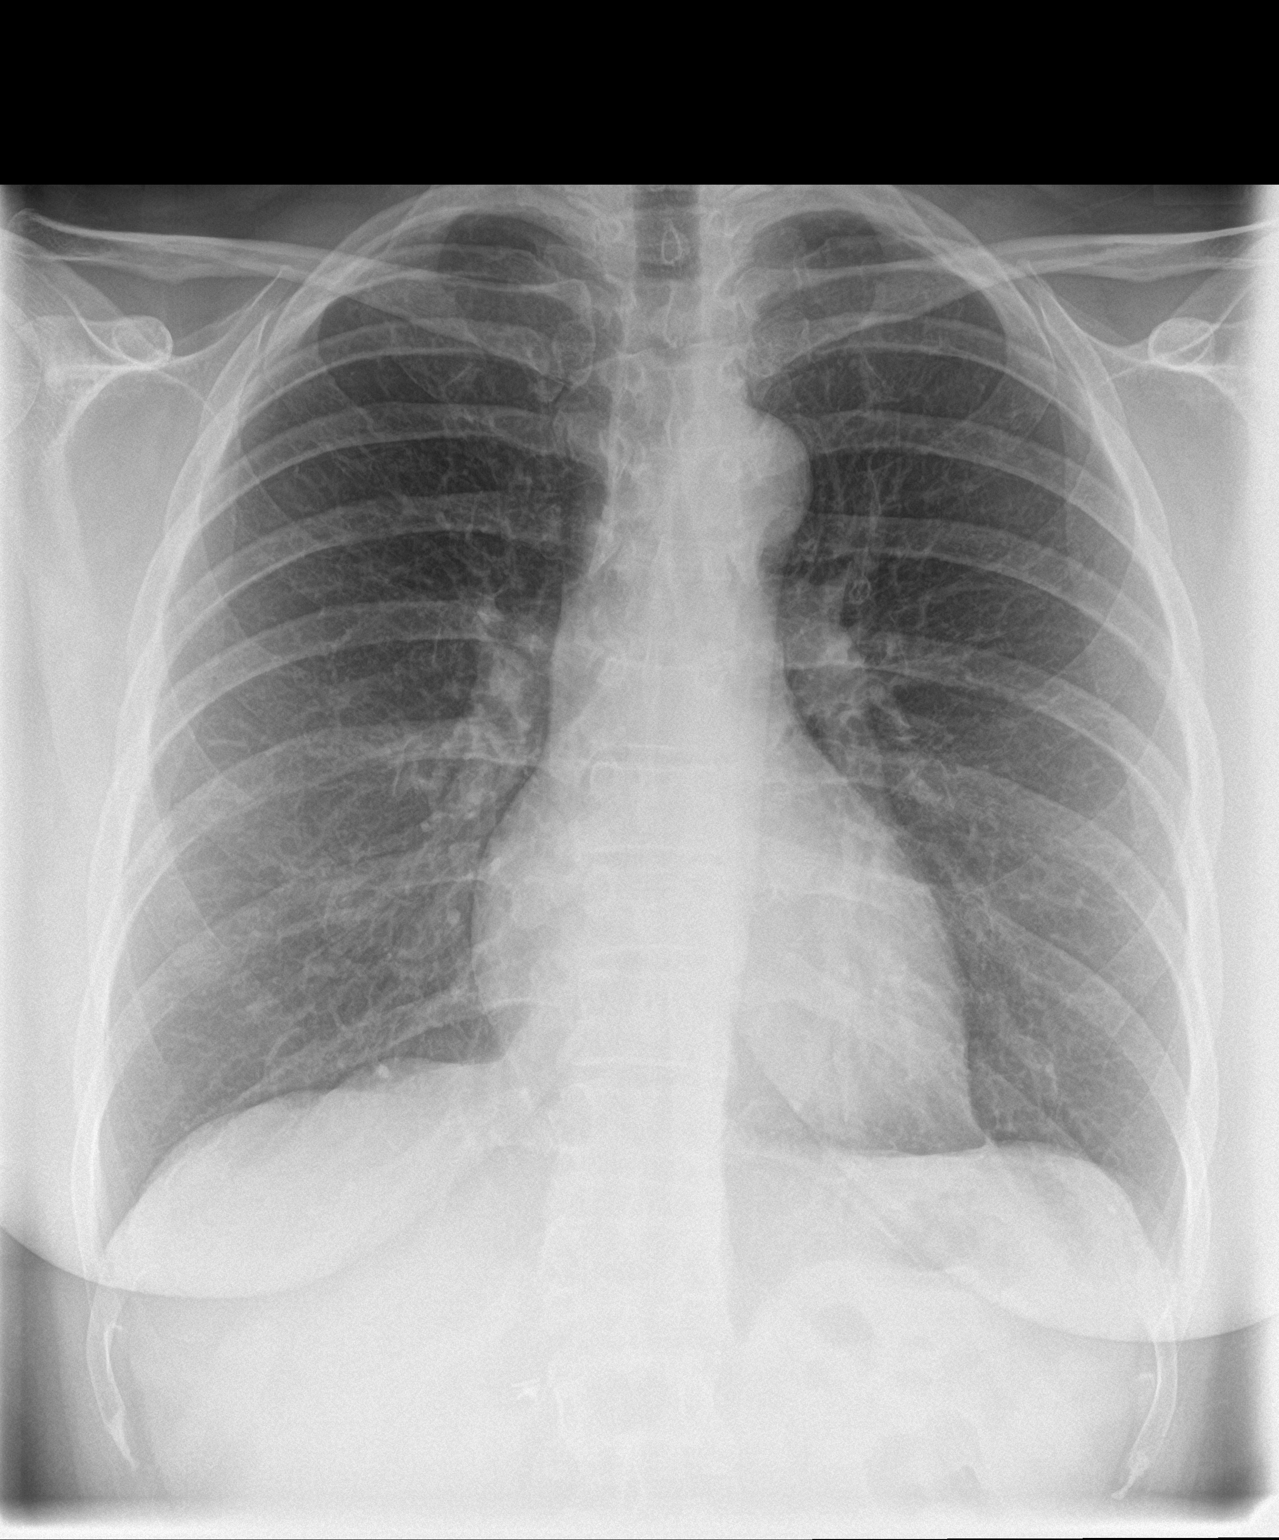

[chest lat]
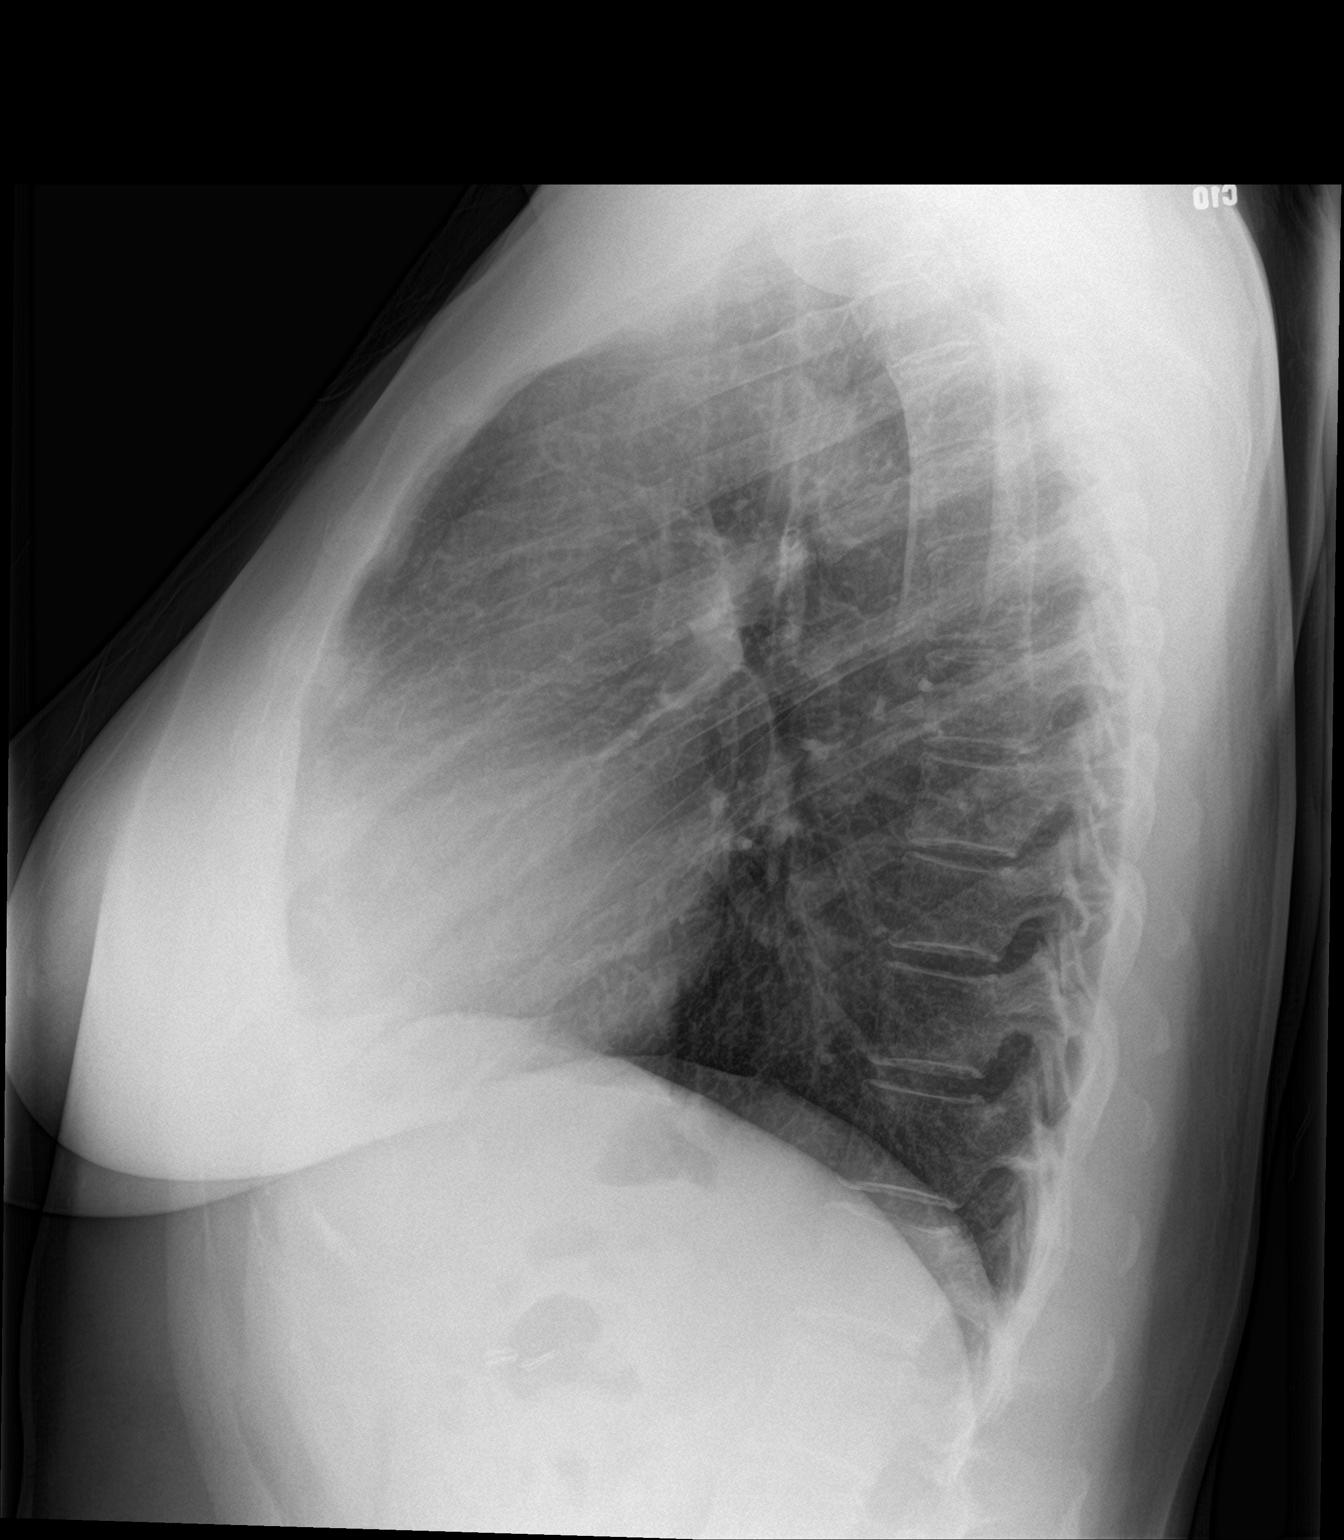

[2 of 2 positions shown; findings below may reference images not displayed]

FINDINGS: The heart size and mediastinal contours are within normal limits.
Both lungs are clear. The visualized skeletal structures are
unremarkable.
IMPRESSION: No active cardiopulmonary disease.

## 2020-02-11 DIAGNOSIS — Z03818 Encounter for observation for suspected exposure to other biological agents ruled out: Secondary | ICD-10-CM | POA: Diagnosis not present

## 2020-02-11 DIAGNOSIS — R05 Cough: Secondary | ICD-10-CM | POA: Diagnosis not present

## 2020-02-11 DIAGNOSIS — J209 Acute bronchitis, unspecified: Secondary | ICD-10-CM | POA: Diagnosis not present

## 2020-02-11 DIAGNOSIS — J029 Acute pharyngitis, unspecified: Secondary | ICD-10-CM | POA: Diagnosis not present

## 2020-02-14 ENCOUNTER — Other Ambulatory Visit: Payer: Self-pay | Admitting: Allergy and Immunology

## 2020-02-19 DIAGNOSIS — E669 Obesity, unspecified: Secondary | ICD-10-CM | POA: Diagnosis not present

## 2020-02-19 DIAGNOSIS — J0100 Acute maxillary sinusitis, unspecified: Secondary | ICD-10-CM | POA: Diagnosis not present

## 2020-02-19 DIAGNOSIS — J209 Acute bronchitis, unspecified: Secondary | ICD-10-CM | POA: Diagnosis not present

## 2020-03-17 ENCOUNTER — Encounter: Payer: Self-pay | Admitting: Allergy and Immunology

## 2020-03-17 ENCOUNTER — Ambulatory Visit: Payer: BC Managed Care – PPO | Admitting: Allergy and Immunology

## 2020-03-17 ENCOUNTER — Other Ambulatory Visit: Payer: Self-pay

## 2020-03-17 VITALS — BP 98/66 | HR 58 | Temp 98.4°F | Resp 12

## 2020-03-17 DIAGNOSIS — R040 Epistaxis: Secondary | ICD-10-CM | POA: Diagnosis not present

## 2020-03-17 DIAGNOSIS — J4540 Moderate persistent asthma, uncomplicated: Secondary | ICD-10-CM

## 2020-03-17 DIAGNOSIS — J3089 Other allergic rhinitis: Secondary | ICD-10-CM | POA: Diagnosis not present

## 2020-03-17 NOTE — Assessment & Plan Note (Signed)
   For now, continue Symbicort (budesonide/formoterol) 160/4.5 g, 2 inhalations via spacer device twice a day, montelukast 10 mg daily at bedtime and albuterol HFA, 1 to 2 inhalations every 4-6 hours if needed.  If subjective and objective measures of pulmonary function remain stable, we will consider stepping down therapy on the next visit.

## 2020-03-17 NOTE — Progress Notes (Signed)
Follow-up Note  RE: Jessica Bradford MRN: [id number] DOB: July 16, 1966 Date of Office Visit: 03/17/2020  Primary care provider: Bradd Canary, MD Referring provider: Bradd Canary, MD  History of present illness: Jessica Bradford is a 54 y.o. female with asthma and allergic rhinitis. She reports that in mid-June she developed bronchitis requiring 2 rounds of antibiotics and 2 rounds of systemic corticosteroids.  The symptoms did resolve after the second round of antibiotics/steroids and she has not had recurrence of symptoms since that time.  She is currently taking Symbicort 160-4.5 g, 2 inhalations via spacer device twice daily, and montelukast 10 mg daily.  While on this regimen she typically requires albuterol rescue 1 time per month on average and does not experience nocturnal awakenings due to lower respiratory symptoms.  She reports that her allergic rhinitis has been well controlled, however this past weekend she was outdoors quite a bit and experienced some nasal congestion and postnasal drainage.  She took levocetirizine with benefit.  She reports that she has had only one episode of epistaxis after having discontinued Xhance.  It took less than 5 minutes to stanch the epistaxis on this occasion.  Assessment and plan: Moderate persistent asthma  For now, continue Symbicort (budesonide/formoterol) 160/4.5 g, 2 inhalations via spacer device twice a day, montelukast 10 mg daily at bedtime and albuterol HFA, 1 to 2 inhalations every 4-6 hours if needed.  If subjective and objective measures of pulmonary function remain stable, we will consider stepping down therapy on the next visit.  Perennial allergic rhinitis  Continue appropriate allergen avoidance measures and carbinoxamine maleate as needed.  Azelastine nasal spray, 1-2 sprays per nostril 2 times daily as needed.   Nasal saline spray (i.e., Simply Saline) or nasal saline lavage (i.e., NeilMed) is recommended as needed and  prior to medicated nasal sprays.  For thick post nasal drainage, add guaifenesin 1200 mg (Mucinex Maximum Strength)  twice daily as needed with adequate hydration as discussed.  Epistaxis  Continue avoidance of nasal steroid sprays.  Nasal saline gel or nasal saline spray as needed.  The use of a cool-mist humidifier during the night is recommended.  During epistaxis, if needed, oxymetazoline (Afrin) nasal spray may be applied to a cotton ball to help stanch the blood flow.  If this problem recurs, persists, or progresses, otolaryngology evaluation (Dr. Suszanne Conners) may be warranted.    Diagnostics: Spirometry:  Normal with an FEV1 of 120% predicted. This study was performed while the patient was asymptomatic.  Please see scanned spirometry results for details.    Physical examination: Blood pressure 98/66, pulse 58, temperature 98.4 F (36.9 C), temperature source Oral, resp. rate 12, SpO2 99 %.  General: Alert, interactive, in no acute distress. HEENT: TMs pearly gray, turbinates mildly edematous without discharge, post-pharynx mildly erythematous. Neck: Supple without lymphadenopathy. Lungs: Clear to auscultation without wheezing, rhonchi or rales. CV: Normal S1, S2 without murmurs. Skin: Warm and dry, without lesions or rashes.  The following portions of the patient's history were reviewed and updated as appropriate: allergies, current medications, past family history, past medical history, past social history, past surgical history and problem list.  Current Outpatient Medications  Medication Sig Dispense Refill  . acyclovir ointment (ZOVIRAX) 5 % acyclovir 5 % topical ointment  COVER ALL ORAL LESIONS EVERY 3 HOURS X7 DAYS    . ALPRAZolam (XANAX) 0.25 MG tablet Take 0.5 tablets by mouth daily as needed.  1  . aspirin 81 MG tablet Take 81 mg  by mouth daily.      Marland Kitchen atorvastatin (LIPITOR) 80 MG tablet TAKE 1 TABLET BY MOUTH EVERY DAY 90 tablet 3  . azelastine (ASTELIN) 0.1 % nasal  spray Place 1-2 sprays into both nostrils 2 (two) times daily as needed for rhinitis. Use in each nostril as directed 30 mL 12  . Carbinoxamine Maleate 4 MG TABS Take 1 tablet (4 mg total) by mouth every 6 (six) hours as needed. 28 each 5  . famotidine (PEPCID) 20 MG tablet TAKE 1 TABLET BY MOUTH TWICE A DAY 50 tablet 2  . fluticasone (FLONASE) 50 MCG/ACT nasal spray Place 2 sprays into both nostrils daily. 16 g 5  . levocetirizine (XYZAL) 5 MG tablet Take 5 mg by mouth every evening.    . montelukast (SINGULAIR) 10 MG tablet TAKE 1 TABLET BY MOUTH EVERYDAY AT BEDTIME 90 tablet 0  . multivitamin (THERAGRAN) per tablet Take 1 tablet by mouth daily.      Marland Kitchen oxybutynin (DITROPAN XL) 15 MG 24 hr tablet Take 1 tablet by mouth daily.  3  . PAZEO 0.7 % SOLN PLACE 1 DROP INTO BOTH EYES DAILY AS NEEDED. 2.5 mL 1  . PROAIR HFA 108 (90 Base) MCG/ACT inhaler TAKE 2 PUFFS BY MOUTH EVERY 6 HOURS AS NEEDED FOR WHEEZE OR SHORTNESS OF BREATH 8.5 g 2  . sertraline (ZOLOFT) 50 MG tablet Take 1 tablet by mouth daily.  2  . SYMBICORT 160-4.5 MCG/ACT inhaler TAKE 2 PUFFS BY MOUTH TWICE A DAY 10.2 Inhaler 3  . testosterone cypionate (DEPO-TESTOSTERONE) 200 MG/ML injection Depo-Testosterone 200 mg/mL intramuscular oil  Given:    GM  Pt tolerated well. Next injection July 30. cw    . VAGIFEM 10 MCG TABS vaginal tablet Place 1 tablet vaginally 2 (two) times a week.    . valACYclovir (VALTREX) 500 MG tablet Take by mouth.     No current facility-administered medications for this visit.    Allergies  Allergen Reactions  . Sulfa Antibiotics Rash  . Doxycycline Rash  . Sulfur Rash    I appreciate the opportunity to take part in Trust's care. Please do not hesitate to contact me with questions.  Sincerely,   R. Jorene Guest, MD

## 2020-03-17 NOTE — Patient Instructions (Addendum)
Moderate persistent asthma  For now, continue Symbicort (budesonide/formoterol) 160/4.5 g, 2 inhalations via spacer device twice a day, montelukast 10 mg daily at bedtime and albuterol HFA, 1 to 2 inhalations every 4-6 hours if needed.  If subjective and objective measures of pulmonary function remain stable, we will consider stepping down therapy on the next visit.  Perennial allergic rhinitis  Continue appropriate allergen avoidance measures and carbinoxamine maleate as needed.  Azelastine nasal spray, 1-2 sprays per nostril 2 times daily as needed.   Nasal saline spray (i.e., Simply Saline) or nasal saline lavage (i.e., NeilMed) is recommended as needed and prior to medicated nasal sprays.  For thick post nasal drainage, add guaifenesin 1200 mg (Mucinex Maximum Strength)  twice daily as needed with adequate hydration as discussed.  Epistaxis  Continue avoidance of nasal steroid sprays.  Nasal saline gel or nasal saline spray as needed.  The use of a cool-mist humidifier during the night is recommended.  During epistaxis, if needed, oxymetazoline (Afrin) nasal spray may be applied to a cotton ball to help stanch the blood flow.  If this problem recurs, persists, or progresses, otolaryngology evaluation (Dr. Suszanne Conners) may be warranted.    Return in about 5 months (around 08/17/2020), or if symptoms worsen or fail to improve.

## 2020-03-17 NOTE — Assessment & Plan Note (Signed)
   Continue appropriate allergen avoidance measures and carbinoxamine maleate as needed.  Azelastine nasal spray, 1-2 sprays per nostril 2 times daily as needed.   Nasal saline spray (i.e., Simply Saline) or nasal saline lavage (i.e., NeilMed) is recommended as needed and prior to medicated nasal sprays.  For thick post nasal drainage, add guaifenesin 1200 mg (Mucinex Maximum Strength)  twice daily as needed with adequate hydration as discussed.

## 2020-03-17 NOTE — Assessment & Plan Note (Signed)
   Continue avoidance of nasal steroid sprays.  Nasal saline gel or nasal saline spray as needed.  The use of a cool-mist humidifier during the night is recommended.  During epistaxis, if needed, oxymetazoline (Afrin) nasal spray may be applied to a cotton ball to help stanch the blood flow.  If this problem recurs, persists, or progresses, otolaryngology evaluation (Dr. Suszanne Conners) may be warranted.

## 2020-03-21 DIAGNOSIS — R6882 Decreased libido: Secondary | ICD-10-CM | POA: Diagnosis not present

## 2020-03-25 ENCOUNTER — Ambulatory Visit: Payer: BC Managed Care – PPO | Admitting: Obstetrics and Gynecology

## 2020-03-25 ENCOUNTER — Other Ambulatory Visit: Payer: Self-pay

## 2020-03-25 ENCOUNTER — Encounter: Payer: Self-pay | Admitting: Obstetrics and Gynecology

## 2020-03-25 VITALS — BP 116/68 | HR 64 | Resp 14 | Ht 65.5 in | Wt 225.6 lb

## 2020-03-25 DIAGNOSIS — N3946 Mixed incontinence: Secondary | ICD-10-CM | POA: Diagnosis not present

## 2020-03-25 DIAGNOSIS — N8110 Cystocele, unspecified: Secondary | ICD-10-CM | POA: Diagnosis not present

## 2020-03-25 NOTE — Progress Notes (Signed)
GYNECOLOGY  VISIT   HPI: 54 y.o.   Married  Caucasian  female   G2P2002 with Patient's last menstrual period was 09/23/2017 (exact date).   here for stress incontinence.   Incontinence is worsening.  Had bronchitis in June and coughing caused leakage.  Exercise prompts urinary incontinence.  Wakes up and her underwear is wet.  Wipes and she has leakage. Leaks urine every day.   Taking Ditropan XL for overactive bladder.  DF - every 3 hours. NF - once a night.   Prior urodynamic testing confirmed mixed incontinence.   Can feel a vaginal bulge at times.   Sexually active and no pain or discomfort.  Receiving testosterone injections for decreased libido.  Using Vagifem.   No constipation or fecal incontinence.  Uses Benifiber every morning.   Pelvic floor PT helped somewhat in the past.   Considering surgery in January, 2022.   Has not been vaccinated against Covid.   GYNECOLOGIC HISTORY: Patient's last menstrual period was 09/23/2017 (exact date). Contraception:  Tubal Menopausal hormone therapy:  Vagifem Last mammogram: 10-15-19 Neg/density B/BiRads1 Last pap smear:   10-09-18 Neg        OB History    Gravida  2   Para  2   Term  2   Preterm      AB      Living  2     SAB      TAB      Ectopic      Multiple      Live Births  2              Patient Active Problem List   Diagnosis Date Noted  . Estrogen deficiency 11/20/2019  . Epistaxis 11/13/2019  . Orthostatic hypotension 05/23/2019  . Other forms of dyspnea 03/06/2019  . Postural dizziness with presyncope 03/02/2019  . Coughing 10/24/2018  . Perennial allergic rhinitis 10/24/2018  . Moderate persistent asthma 10/24/2018  . Allergic conjunctivitis 10/24/2018  . Anxiety 10/16/2018  . Right hip pain 04/13/2018  . Anemia 10/15/2017  . Preventative health care 10/15/2017  . Hemorrhoids 10/15/2017  . Elevated serum creatinine 10/14/2017  . Obesity 10/14/2017  . History of chicken pox    . MVP (mitral valve prolapse) 07/06/2011  . Hyperlipidemia 07/06/2011    Past Medical History:  Diagnosis Date  . Anemia   . Anxiety   . Asthma 2020  . Heart palpitations   . History of chicken pox   . Hyperlipidemia   . Incomplete right bundle branch block   . Mitral valve prolapse   . Urinary incontinence     Past Surgical History:  Procedure Laterality Date  . CHOLECYSTECTOMY    . TUBAL LIGATION  1996  . TUBAL LIGATION      Current Outpatient Medications  Medication Sig Dispense Refill  . acyclovir ointment (ZOVIRAX) 5 % acyclovir 5 % topical ointment  COVER ALL ORAL LESIONS EVERY 3 HOURS X7 DAYS    . ALPRAZolam (XANAX) 0.25 MG tablet Take 0.5 tablets by mouth daily as needed.  1  . aspirin 81 MG tablet Take 81 mg by mouth daily.      Marland Kitchen atorvastatin (LIPITOR) 80 MG tablet TAKE 1 TABLET BY MOUTH EVERY DAY 90 tablet 3  . Carbinoxamine Maleate 4 MG TABS Take 1 tablet (4 mg total) by mouth every 6 (six) hours as needed. 28 each 5  . famotidine (PEPCID) 20 MG tablet TAKE 1 TABLET BY MOUTH TWICE A DAY 50  tablet 2  . levocetirizine (XYZAL) 5 MG tablet Take 5 mg by mouth every evening.    . multivitamin (THERAGRAN) per tablet Take 1 tablet by mouth daily.      Marland Kitchen oxybutynin (DITROPAN XL) 15 MG 24 hr tablet Take 1 tablet by mouth daily.  3  . PAZEO 0.7 % SOLN PLACE 1 DROP INTO BOTH EYES DAILY AS NEEDED. 2.5 mL 1  . PROAIR HFA 108 (90 Base) MCG/ACT inhaler TAKE 2 PUFFS BY MOUTH EVERY 6 HOURS AS NEEDED FOR WHEEZE OR SHORTNESS OF BREATH 8.5 g 2  . sertraline (ZOLOFT) 50 MG tablet Take 1 tablet by mouth daily.  2  . SYMBICORT 160-4.5 MCG/ACT inhaler TAKE 2 PUFFS BY MOUTH TWICE A DAY 10.2 Inhaler 3  . testosterone cypionate (DEPO-TESTOSTERONE) 200 MG/ML injection Depo-Testosterone 200 mg/mL intramuscular oil  Given:    GM  Pt tolerated well. Next injection July 30. cw    . VAGIFEM 10 MCG TABS vaginal tablet Place 1 tablet vaginally 2 (two) times a week.    . valACYclovir (VALTREX)  500 MG tablet Take by mouth.     No current facility-administered medications for this visit.     ALLERGIES: Sulfa antibiotics, Doxycycline, and Sulfur  Family History  Problem Relation Age of Onset  . Stroke Father   . Hypertension Father   . Alcohol abuse Father   . Heart attack Mother   . Alcohol abuse Sister   . Alcohol abuse Brother   . Drug abuse Brother        Heroin  . Heart attack Brother   . Alcohol abuse Brother   . Drug abuse Brother        THC  . Heart disease Maternal Grandmother   . Heart disease Maternal Grandfather   . Hypertension Paternal Grandmother   . Dementia Paternal Grandmother   . Cancer Paternal Grandfather 77       lung, smoker  . Other Paternal Grandfather        noncancerous tumor    Social History   Socioeconomic History  . Marital status: Married    Spouse name: Not on file  . Number of children: Not on file  . Years of education: Not on file  . Highest education level: Not on file  Occupational History  . Not on file  Tobacco Use  . Smoking status: Never Smoker  . Smokeless tobacco: Never Used  Vaping Use  . Vaping Use: Never used  Substance and Sexual Activity  . Alcohol use: Yes    Comment: occ  . Drug use: No  . Sexual activity: Yes    Birth control/protection: Surgical    Comment: Tubal  Other Topics Concern  . Not on file  Social History Narrative   Works for [employer], no major dietary restrictions, lives with husband and their dog   Social Determinants of Health   Financial Resource Strain:   . Difficulty of Paying Living Expenses:   Food Insecurity:   . Worried About Programme researcher, broadcasting/film/video in the Last Year:   . Barista in the Last Year:   Transportation Needs:   . Freight forwarder (Medical):   Marland Kitchen Lack of Transportation (Non-Medical):   Physical Activity:   . Days of Exercise per Week:   . Minutes of Exercise per Session:   Stress:   . Feeling of Stress :   Social Connections:   . Frequency of  Communication with Friends and Family:   .  Frequency of Social Gatherings with Friends and Family:   . Attends Religious Services:   . Active Member of Clubs or Organizations:   . Attends Banker Meetings:   Marland Kitchen Marital Status:   Intimate Partner Violence:   . Fear of Current or Ex-Partner:   . Emotionally Abused:   Marland Kitchen Physically Abused:   . Sexually Abused:     Review of Systems  Genitourinary:       Stress urinary incontinence--cough, laugh, sneeze  All other systems reviewed and are negative.   PHYSICAL EXAMINATION:    BP 116/68 (Cuff Size: Large)   Pulse 64   Resp 14   Ht 5' 5.5" (1.664 m)   Wt 225 lb 9.6 oz (102.3 kg)   LMP 09/23/2017 (Exact Date)   BMI 36.97 kg/m     General appearance: alert, cooperative and appears stated age Lungs: clear to auscultation bilaterally Heart: regular rate and rhythm Abdomen: soft, non-tender, no masses,  no organomegaly.  Small umbilical hernia. No abnormal inguinal nodes palpated  Pelvic: External genitalia:  no lesions              Urethra:  normal appearing urethra with no masses, tenderness or lesions              Bartholins and Skenes: normal                 Vagina: normal appearing vagina with normal color and discharge, no lesions.  First to second degree cystocele.  Minimal uterine prolapse.  Good posterior vaginal wall support.               Cervix: no lesions                Bimanual Exam:  Uterus:  normal size, contour, position, consistency, mobility, non-tender              Adnexa: no mass, fullness, tenderness              Rectal exam: Yes.  .  Confirms.              Anus:  normal sphincter tone, no lesions  Chaperone was present for exam.  ASSESSMENT  Mixed incontinence.  On Ditropan XL. Bladder prolapse.  Looks relatively stable.   PLAN  We reviewed stress incontinence and overactive bladder.   Options for tx of stress include pelvic floor therapy, pessary use, and surgery. We focused our  discussion on a midurethral sling and anterior colporrhaphy today.  We discussed permanent mesh materials used for the sling.  I quoted a success rate of 85 - 90% effective in treatment of stress incontinence using the sling.  Risks of slings including but not limited to erosions and exposure, cystotomy, urinary retention, increase in urgency symptoms, need for prolonged catheterization or self catheterization, urinary tract infections, bleeding, infection, damage to surrounding organs, reaction to anesthesia, DVT, PE, death, need for reoperation, and recurrence of incontinence and prolapse.    ACOG HO on surgery for urinary incontinence.   She will consider her options and return prn.

## 2020-03-29 ENCOUNTER — Other Ambulatory Visit: Payer: Self-pay | Admitting: Family Medicine

## 2020-04-08 ENCOUNTER — Other Ambulatory Visit: Payer: Self-pay | Admitting: Allergy and Immunology

## 2020-04-22 ENCOUNTER — Other Ambulatory Visit: Payer: Self-pay | Admitting: Allergy and Immunology

## 2020-04-29 DIAGNOSIS — R638 Other symptoms and signs concerning food and fluid intake: Secondary | ICD-10-CM | POA: Diagnosis not present

## 2020-04-29 DIAGNOSIS — E669 Obesity, unspecified: Secondary | ICD-10-CM | POA: Diagnosis not present

## 2020-04-29 DIAGNOSIS — Z6836 Body mass index (BMI) 36.0-36.9, adult: Secondary | ICD-10-CM | POA: Diagnosis not present

## 2020-04-29 DIAGNOSIS — E782 Mixed hyperlipidemia: Secondary | ICD-10-CM | POA: Diagnosis not present

## 2020-04-29 DIAGNOSIS — R5383 Other fatigue: Secondary | ICD-10-CM | POA: Diagnosis not present

## 2020-04-29 DIAGNOSIS — Z13228 Encounter for screening for other metabolic disorders: Secondary | ICD-10-CM | POA: Diagnosis not present

## 2020-05-05 DIAGNOSIS — Z6836 Body mass index (BMI) 36.0-36.9, adult: Secondary | ICD-10-CM | POA: Diagnosis not present

## 2020-05-05 DIAGNOSIS — Z713 Dietary counseling and surveillance: Secondary | ICD-10-CM | POA: Diagnosis not present

## 2020-05-05 DIAGNOSIS — E669 Obesity, unspecified: Secondary | ICD-10-CM | POA: Diagnosis not present

## 2020-05-05 DIAGNOSIS — F419 Anxiety disorder, unspecified: Secondary | ICD-10-CM | POA: Diagnosis not present

## 2020-05-19 ENCOUNTER — Other Ambulatory Visit: Payer: Self-pay | Admitting: Allergy and Immunology

## 2020-05-20 ENCOUNTER — Other Ambulatory Visit: Payer: Self-pay

## 2020-05-20 MED ORDER — MONTELUKAST SODIUM 10 MG PO TABS: ORAL_TABLET | ORAL | 0 refills | Status: DC

## 2020-05-21 DIAGNOSIS — Z6836 Body mass index (BMI) 36.0-36.9, adult: Secondary | ICD-10-CM | POA: Diagnosis not present

## 2020-05-21 DIAGNOSIS — Z713 Dietary counseling and surveillance: Secondary | ICD-10-CM | POA: Diagnosis not present

## 2020-05-21 DIAGNOSIS — E669 Obesity, unspecified: Secondary | ICD-10-CM | POA: Diagnosis not present

## 2020-05-21 DIAGNOSIS — R6882 Decreased libido: Secondary | ICD-10-CM | POA: Diagnosis not present

## 2020-05-21 DIAGNOSIS — E782 Mixed hyperlipidemia: Secondary | ICD-10-CM | POA: Diagnosis not present

## 2020-05-25 ENCOUNTER — Encounter: Payer: Self-pay | Admitting: Cardiovascular Disease

## 2020-05-25 NOTE — Progress Notes (Signed)
Cardiology Office Note   Date:  05/26/2020   ID:  Jessica Bradford, DOB 02-21-1966, MRN [id number]  PCP:  April Manson, NP  Cardiologist:   Kristeen Miss, MD   1. Hyperliidemia 2. Dizziness    Previous notes:  Jessica Bradford is a 54 year old female with a history of hypercholesterolemia. She has a very strong family history of coronary artery disease. She's done very well since she last saw Dr. Deborah Chalk last year. She walks 4-6 miles every day. She denies any chest pain or shortness of breath with her walking.  She has a history of mitral valve prolapse. She has occasional palpitations. These typically occur when she is at rest.  She is recovering from an episode of cellulitis. She apparently stepped on a splinter on her deck and developed cellulitis in her foot. She was on antibiotics for several weeks. She is still not exercising because of some persistent foot pain and swelling.  Nov. 19, 2014:  No CP, no dyspnea, Still walking 5+ miles a day. Also doing strength training. She gets a discount from her insurance plan to work out    Jessica Bradford 2, 2016:  Jessica Bradford is a 54 y.o. female who presents for  Follow up of her hyperlipidemia Walks regularly,  Also does strength training.  Trying to watch her diet.   Jessica Bradford 17, 2017: Doing well  Has had several episodes of waking up "thinking she was not breathing " Snores some but does not have apneic episodes according to husband  Exercising some .   [place] twice a week , walks the other days  Has had difficulty losing weight .   January 26, 2017:  Jessica Bradford is doing well from a cardiac standpoint Is very fatigued for the past 6 months . Is tired all of the time  Takes naps frequently  Still working out at [employer] - works outs are going ok .   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    Is losing some hair ( which is some concern)    March 02, 2019 :  Jessica Bradford is see today for episodes of dizziness.  Hx of hyperlipidemia  3 times over the past 7 weeks. Near syncope.   Feels fine and then has palpitations,  Legs feel like jello and has near syncope  One episode happened at [facility],  She bought a BP cuff,  BP was 95/64 when she got home Has not occurred since June 27.  Drinks water  All have occurred as she is standing up  From a seated position .  2 episodes at lunch   Breakfast - oatmeal  Or toast, yogart Lunch - salad or sandwich Dinner - frequently does not have meat .    Echo in 2012 was normal.  Has worn a monitor about 10 years ago    Oct. 4, 2021: Jessica Bradford is seen today for follow up of her hyperlipidemia and dizziness Has had near syncope  Had episodes of "legs feel like jello"  Event monitor - revealed sinus rhythm including sinus tach and sinus brady.   No serious arrhythmias seen Echo - July 2020 revealed trace MR, trae TR, normal LV function Has had 2 episodes of near syncope.    Has been working out with a Systems analyst who noted that her HR was slow Her HR during exercise goes up  to the 90s    Past Medical History:  Diagnosis Date   Anemia    Anxiety    Asthma 2020   Heart palpitations    History of chicken pox    Hyperlipidemia    Incomplete right bundle branch block    Mitral valve prolapse    Urinary incontinence     Past Surgical History:  Procedure Laterality Date   CHOLECYSTECTOMY     TUBAL LIGATION  1996   TUBAL LIGATION       Current Outpatient Medications  Medication Sig Dispense Refill   acyclovir ointment (ZOVIRAX) 5 % acyclovir 5 % topical ointment  COVER ALL ORAL LESIONS EVERY 3 HOURS X7 DAYS     ALPRAZolam (XANAX) 0.25 MG tablet Take 0.5 tablets by mouth daily as needed.  1   aspirin 81 MG tablet Take 81 mg by mouth daily.       atorvastatin (LIPITOR) 80 MG tablet TAKE 1 TABLET BY MOUTH EVERY DAY 90 tablet 3   azelastine (ASTELIN) 0.1 % nasal spray  Place 1 spray into both nostrils 2 (two) times daily as needed.     Carbinoxamine Maleate 4 MG TABS Take 1 tablet (4 mg total) by mouth every 6 (six) hours as needed. 28 each 5   famotidine (PEPCID) 20 MG tablet TAKE 1 TABLET BY MOUTH TWICE A DAY 50 tablet 2   L-Lysine 1000 MG TABS Take 1 tablet by mouth daily.     levocetirizine (XYZAL) 5 MG tablet Take 5 mg by mouth every evening.     montelukast (SINGULAIR) 10 MG tablet TAKE 1 TABLET BY MOUTH EVERYDAY AT BEDTIME 90 tablet 0   multivitamin (THERAGRAN) per tablet Take 1 tablet by mouth daily.       oxybutynin (DITROPAN XL) 15 MG 24 hr tablet Take 1 tablet by mouth daily.  3   PAZEO 0.7 % SOLN PLACE 1 DROP INTO BOTH EYES DAILY AS NEEDED. 2.5 mL 1   PROAIR HFA 108 (90 Base) MCG/ACT inhaler TAKE 2 PUFFS BY MOUTH EVERY 6 HOURS AS NEEDED FOR WHEEZE OR SHORTNESS OF BREATH 8.5 g 2   sertraline (ZOLOFT) 50 MG tablet Take 1 tablet by mouth daily.  2   SYMBICORT 160-4.5 MCG/ACT inhaler TAKE 2 PUFFS BY MOUTH TWICE A DAY 1 each 5   testosterone cypionate (DEPO-TESTOSTERONE) 200 MG/ML injection Depo-Testosterone 200 mg/mL intramuscular oil  Given:    GM  Pt tolerated well. Next injection July 30. cw     topiramate (TOPAMAX) 25 MG tablet Take 1 tablet by mouth daily. For 30 days     VAGIFEM 10 MCG TABS vaginal tablet Place 1 tablet vaginally 2 (two) times a week.     valACYclovir (VALTREX) 500 MG tablet Take by mouth.     No current facility-administered medications for this visit.    Allergies:   Sulfa antibiotics, Doxycycline, and Sulfur    Social History:  The patient  reports that she has never smoked. She has never used smokeless tobacco. She reports current alcohol use. She reports that she does not use drugs.   Family History:  The patient's family history includes Alcohol abuse in her brother, brother, father, and sister; Cancer (age of onset: 12) in her paternal grandfather; Dementia in her paternal grandmother; Drug abuse in  her brother and brother; Heart attack in her brother and mother; Heart disease in her maternal grandfather and maternal grandmother; Hypertension in her father and paternal grandmother; Other in her paternal grandfather; Stroke  in her father.    ROS:  Please see the history of present illness.    Physical Exam: Blood pressure 110/76, pulse (!) 54, height 5\' 6"  (1.676 m), weight 228 lb (103.4 kg), last menstrual period 09/23/2017, SpO2 97 %.  GEN:  Well nourished, well developed in no acute distress HEENT: Normal NECK: No JVD; No carotid bruits LYMPHATICS: No lymphadenopathy CARDIAC: RRR , no murmurs, rubs, gallops RESPIRATORY:  Clear to auscultation without rales, wheezing or rhonchi  ABDOMEN: Soft, non-tender, non-distended MUSCULOSKELETAL:  No edema; No deformity  SKIN: Warm and dry NEUROLOGIC:  Alert and oriented x 3     EKG:    May 26, 2020: Sinus bradycardia 54 beats minute.  No ST or T wave changes.  Recent Labs: 11/20/2019: ALT 24; BUN 21; Creatinine, Ser 0.90; Hemoglobin 13.8; Platelets 204.0; Potassium 4.5; Sodium 141; TSH 2.47    Lipid Panel    Component Value Date/Time   CHOL 157 11/20/2019 1530   CHOL 146 02/16/2018 0758   CHOL 136 07/11/2013 0925   TRIG 67.0 11/20/2019 1530   TRIG 96 07/11/2013 0925   HDL 64.90 11/20/2019 1530   HDL 57 02/16/2018 0758   HDL 57 07/11/2013 0925   CHOLHDL 2 11/20/2019 1530   VLDL 13.4 11/20/2019 1530   LDLCALC 78 11/20/2019 1530   LDLCALC 74 02/16/2018 0758   LDLCALC 60 07/11/2013 0925      Wt Readings from Last 3 Encounters:  05/26/20 228 lb (103.4 kg)  03/25/20 225 lb 9.6 oz (102.3 kg)  11/20/19 225 lb 6.4 oz (102.2 kg)      Other studies Reviewed: Additional studies/ records that were reviewed today include: . Review of the above records demonstrates:   ASSESSMENT AND PLAN:  1.   Dizziness:    Jessica Bradford still has occasional episodes of dizziness.  She has never had any episodes of syncope.  2.  Hyperliidemia-    continue atorvastatin.  Will check labs today.  3.  Sinus bradycardia:   When she works out she can only achieve a heart rate of the low to mid 90s.  Her TSH is normal.  At this point there is no medication changes or other procedures that are indicated.  I detailed did tell her that she Jessica Bradford be more likely to need a pacemaker when she gets older but for now there are no indications for pacer.    4.  Systolic murmur:       Current medicines are reviewed at length with the patient today.  The patient does not have concerns regarding medicines.  The following changes have been made:  no change  Labs/ tests ordered today include:   Orders Placed This Encounter  Procedures   Lipid Profile   Basic Metabolic Panel (BMET)   Hepatic function panel   EKG 12-Lead      Disposition:   FU with me in 1 year     11/22/19, MD  05/26/2020 10:37 AM    [facility] [address] [place], [place], [place]  [place] Phone: [phone]; Fax: [phone]

## 2020-05-26 ENCOUNTER — Ambulatory Visit (INDEPENDENT_AMBULATORY_CARE_PROVIDER_SITE_OTHER): Payer: BC Managed Care – PPO | Admitting: Cardiovascular Disease

## 2020-05-26 ENCOUNTER — Encounter: Payer: Self-pay | Admitting: Cardiovascular Disease

## 2020-05-26 ENCOUNTER — Other Ambulatory Visit: Payer: Self-pay

## 2020-05-26 VITALS — BP 110/76 | HR 54 | Ht 66.0 in | Wt 228.0 lb

## 2020-05-26 DIAGNOSIS — R011 Cardiac murmur, unspecified: Secondary | ICD-10-CM | POA: Diagnosis not present

## 2020-05-26 DIAGNOSIS — R001 Bradycardia, unspecified: Secondary | ICD-10-CM | POA: Diagnosis not present

## 2020-05-26 DIAGNOSIS — E782 Mixed hyperlipidemia: Secondary | ICD-10-CM | POA: Diagnosis not present

## 2020-05-26 LAB — BASIC METABOLIC PANEL
BUN/Creatinine Ratio: 17 (ref 9–23)
BUN: 17 mg/dL (ref 6–24)
CO2: 25 mmol/L (ref 20–29)
Calcium: 9.3 mg/dL (ref 8.7–10.2)
Chloride: 108 mmol/L — ABNORMAL HIGH (ref 96–106)
Creatinine, Ser: 1.01 mg/dL — ABNORMAL HIGH (ref 0.57–1.00)
GFR calc Af Amer: 72 mL/min/{1.73_m2} (ref >59)
GFR calc non Af Amer: 63 mL/min/{1.73_m2} (ref >59)
Glucose: 100 mg/dL — ABNORMAL HIGH (ref 65–99)
Potassium: 4.3 mmol/L (ref 3.5–5.2)
Sodium: 142 mmol/L (ref 134–144)

## 2020-05-26 LAB — HEPATIC FUNCTION PANEL
ALT: 22 IU/L (ref 0–32)
AST: 24 IU/L (ref 0–40)
Albumin: 4.3 g/dL (ref 3.8–4.9)
Alkaline Phosphatase: 67 IU/L (ref 44–121)
Bilirubin Total: 0.9 mg/dL (ref 0.0–1.2)
Bilirubin, Direct: 0.24 mg/dL (ref 0.00–0.40)
Total Protein: 6.3 g/dL (ref 6.0–8.5)

## 2020-05-26 LAB — LIPID PANEL
Chol/HDL Ratio: 2.2 ratio (ref 0.0–4.4)
Cholesterol, Total: 132 mg/dL (ref 100–199)
HDL: 59 mg/dL (ref >39)
LDL Chol Calc (NIH): 62 mg/dL (ref 0–99)
Triglycerides: 51 mg/dL (ref 0–149)
VLDL Cholesterol Cal: 11 mg/dL (ref 5–40)

## 2020-05-26 NOTE — Patient Instructions (Signed)
Medication Instructions:  Your physician recommends that you continue on your current medications as directed. Please refer to the Current Medication list given to you today.  *If you need a refill on your cardiac medications before your next appointment, please call your pharmacy*   Lab Work: TODAY - cholesterol, liver panel, basic metabolic panel If you have labs (blood work) drawn today and your tests are completely normal, you will receive your results only by: MyChart Message (if you have MyChart) OR A paper copy in the mail If you have any lab test that is abnormal or we need to change your treatment, we will call you to review the results.   Testing/Procedures: None Ordered   Follow-Up: At [facility], you and your health needs are our priority.  As part of our continuing mission to provide you with exceptional heart care, we have created designated Provider Care Teams.  These Care Teams include your primary Cardiologist (physician) and Advanced Practice Providers (APPs -  Physician Assistants and Nurse Practitioners) who all work together to provide you with the care you need, when you need it.  Your next appointment:   1 year(s)  The format for your next appointment:   In Person  Provider:   You may see Philip Nahser, MD or one of the following Advanced Practice Providers on your designated Care Team:   Scott Weaver, PA-C Vin Bhagat, PA-C   

## 2020-05-28 ENCOUNTER — Other Ambulatory Visit: Payer: Self-pay

## 2020-05-28 MED ORDER — ALBUTEROL SULFATE HFA 108 (90 BASE) MCG/ACT IN AERS: INHALATION_SPRAY | RESPIRATORY_TRACT | 2 refills | Status: AC

## 2020-06-12 ENCOUNTER — Other Ambulatory Visit: Payer: Self-pay | Admitting: Family Medicine

## 2020-06-12 DIAGNOSIS — Z6836 Body mass index (BMI) 36.0-36.9, adult: Secondary | ICD-10-CM | POA: Diagnosis not present

## 2020-06-12 DIAGNOSIS — R638 Other symptoms and signs concerning food and fluid intake: Secondary | ICD-10-CM | POA: Diagnosis not present

## 2020-06-12 DIAGNOSIS — E669 Obesity, unspecified: Secondary | ICD-10-CM | POA: Diagnosis not present

## 2020-06-12 DIAGNOSIS — J4540 Moderate persistent asthma, uncomplicated: Secondary | ICD-10-CM | POA: Diagnosis not present

## 2020-06-12 DIAGNOSIS — Z713 Dietary counseling and surveillance: Secondary | ICD-10-CM | POA: Diagnosis not present

## 2020-06-13 ENCOUNTER — Other Ambulatory Visit: Payer: Self-pay

## 2020-06-13 MED ORDER — ATORVASTATIN CALCIUM 80 MG PO TABS
80.0000 mg | ORAL_TABLET | Freq: Every day | ORAL | 3 refills | Status: DC
Start: 2020-06-13 — End: 2021-05-22

## 2020-06-24 DIAGNOSIS — E669 Obesity, unspecified: Secondary | ICD-10-CM | POA: Diagnosis not present

## 2020-06-24 DIAGNOSIS — Z6836 Body mass index (BMI) 36.0-36.9, adult: Secondary | ICD-10-CM | POA: Diagnosis not present

## 2020-06-24 DIAGNOSIS — Z713 Dietary counseling and surveillance: Secondary | ICD-10-CM | POA: Diagnosis not present

## 2020-07-22 DIAGNOSIS — R0981 Nasal congestion: Secondary | ICD-10-CM | POA: Diagnosis not present

## 2020-07-22 DIAGNOSIS — Z713 Dietary counseling and surveillance: Secondary | ICD-10-CM | POA: Diagnosis not present

## 2020-07-22 DIAGNOSIS — Z6836 Body mass index (BMI) 36.0-36.9, adult: Secondary | ICD-10-CM | POA: Diagnosis not present

## 2020-07-22 DIAGNOSIS — E669 Obesity, unspecified: Secondary | ICD-10-CM | POA: Diagnosis not present

## 2020-07-31 DIAGNOSIS — R6882 Decreased libido: Secondary | ICD-10-CM | POA: Diagnosis not present

## 2020-08-19 DIAGNOSIS — R5383 Other fatigue: Secondary | ICD-10-CM | POA: Diagnosis not present

## 2020-08-19 DIAGNOSIS — Z6835 Body mass index (BMI) 35.0-35.9, adult: Secondary | ICD-10-CM | POA: Diagnosis not present

## 2020-08-19 DIAGNOSIS — E669 Obesity, unspecified: Secondary | ICD-10-CM | POA: Diagnosis not present

## 2020-08-19 DIAGNOSIS — Z713 Dietary counseling and surveillance: Secondary | ICD-10-CM | POA: Diagnosis not present

## 2020-08-21 ENCOUNTER — Other Ambulatory Visit: Payer: Self-pay

## 2020-08-21 ENCOUNTER — Ambulatory Visit (INDEPENDENT_AMBULATORY_CARE_PROVIDER_SITE_OTHER): Payer: BC Managed Care – PPO | Admitting: Allergy and Immunology

## 2020-08-21 ENCOUNTER — Encounter: Payer: Self-pay | Admitting: Allergy and Immunology

## 2020-08-21 DIAGNOSIS — J3089 Other allergic rhinitis: Secondary | ICD-10-CM | POA: Diagnosis not present

## 2020-08-21 DIAGNOSIS — J4540 Moderate persistent asthma, uncomplicated: Secondary | ICD-10-CM | POA: Diagnosis not present

## 2020-08-21 NOTE — Patient Instructions (Addendum)
Moderate persistent asthma Well-controlled, we will stepdown therapy at this time.  Discontinue montelukast.  If lower respiratory symptoms progress in frequency and/or severity, the patient is to resume this medication.  Continue Symbicort 160-4.5 g, 2 inhalations via spacer device twice daily.  Continue albuterol HFA, 1 to 2 inhalations every 4-6 hours if needed.  Subjective and objective measures of pulmonary function will be followed and the treatment plan will be adjusted accordingly.  Perennial allergic rhinitis  Continue appropriate allergen avoidance measures and carbinoxamine maleate if needed.  Azelastine nasal spray, 1-2 sprays per nostril 2 times daily as needed.   Nasal saline spray (i.e., Simply Saline) or nasal saline lavage (i.e., NeilMed) is recommended as needed and prior to medicated nasal sprays.  For thick post nasal drainage, add guaifenesin 1200 mg (Mucinex Maximum Strength)  twice daily as needed with adequate hydration as discussed.   Return in about 5 months (around 01/19/2021), or if symptoms worsen or fail to improve.

## 2020-08-21 NOTE — Assessment & Plan Note (Signed)
Well-controlled, we will stepdown therapy at this time.  Discontinue montelukast.  If lower respiratory symptoms progress in frequency and/or severity, the patient is to resume this medication.  Continue Symbicort 160-4.5 g, 2 inhalations via spacer device twice daily.  Continue albuterol HFA, 1 to 2 inhalations every 4-6 hours if needed.  Subjective and objective measures of pulmonary function will be followed and the treatment plan will be adjusted accordingly.

## 2020-08-21 NOTE — Assessment & Plan Note (Addendum)
   Continue appropriate allergen avoidance measures and carbinoxamine maleate if needed.  Azelastine nasal spray, 1-2 sprays per nostril 2 times daily as needed.   Nasal saline spray (i.e., Simply Saline) or nasal saline lavage (i.e., NeilMed) is recommended as needed and prior to medicated nasal sprays.  For thick post nasal drainage, add guaifenesin 1200 mg (Mucinex Maximum Strength)  twice daily as needed with adequate hydration as discussed.

## 2020-08-21 NOTE — Progress Notes (Signed)
Follow-up Note  RE: Jessica Bradford MRN: [id number] DOB: [birth year] Date of Office Visit: 08/21/2020  Primary care provider: April Manson, NP Referring provider: Bradd Canary, MD  History of present illness: Jessica Bradford is a 54 y.o. female with asthma and allergic rhinitis presenting today for follow-up.  She was last seen in this clinic in July 2021.  She reports that in the interval since her previous visit her asthma has been well controlled while taking Symbicort 160-4.5 micro grams, 2 inhalations via spacer twice twice daily, and montelukast 10 mg daily.  She can even remember the last time she required albuterol rescue and does not experience limitations in normal daily activities or nocturnal awakenings due to lower respiratory symptoms.  She reports that her nasal allergy symptoms are well controlled with azelastine and/or guaifenesin if needed.  Her ACT score today is 25.  Assessment and plan: Moderate persistent asthma Well-controlled, we will stepdown therapy at this time.  Discontinue montelukast.  If lower respiratory symptoms progress in frequency and/or severity, the patient is to resume this medication.  Continue Symbicort 160-4.5 g, 2 inhalations via spacer device twice daily.  Continue albuterol HFA, 1 to 2 inhalations every 4-6 hours if needed.  Subjective and objective measures of pulmonary function will be followed and the treatment plan will be adjusted accordingly.  Perennial allergic rhinitis  Continue appropriate allergen avoidance measures and carbinoxamine maleate if needed.  Azelastine nasal spray, 1-2 sprays per nostril 2 times daily as needed.   Nasal saline spray (i.e., Simply Saline) or nasal saline lavage (i.e., NeilMed) is recommended as needed and prior to medicated nasal sprays.  For thick post nasal drainage, add guaifenesin 1200 mg (Mucinex Maximum Strength)  twice daily as needed with adequate hydration as  discussed.   Diagnostics: Due to Covid precautions, spirometry was not performed today as the patient's symptoms are reported as well controlled and pulmonary exam was normal.    Physical examination: Blood pressure 100/70, pulse 64, temperature 97.6 F (36.4 C), temperature source Tympanic, resp. rate 16, last menstrual period 09/23/2017, SpO2 98 %.  General: Alert, interactive, in no acute distress. HEENT: TMs pearly gray, turbinates minimally edematous without discharge, post-pharynx mildly erythematous. Neck: Supple without lymphadenopathy. Lungs: Clear to auscultation without wheezing, rhonchi or rales. CV: Normal S1, S2 without murmurs. Skin: Warm and dry, without lesions or rashes.  The following portions of the patient's history were reviewed and updated as appropriate: allergies, current medications, past family history, past medical history, past social history, past surgical history and problem list.  Current Outpatient Medications  Medication Sig Dispense Refill  . albuterol (PROAIR HFA) 108 (90 Base) MCG/ACT inhaler TAKE 2 PUFFS BY MOUTH EVERY 6 HOURS AS NEEDED FOR WHEEZE OR SHORTNESS OF BREATH 8.5 g 2  . ALPRAZolam (XANAX) 0.25 MG tablet Take 0.5 tablets by mouth daily as needed.  1  . aspirin 81 MG tablet Take 81 mg by mouth daily.    Marland Kitchen atorvastatin (LIPITOR) 80 MG tablet Take 1 tablet (80 mg total) by mouth daily. 90 tablet 3  . Carbinoxamine Maleate 4 MG TABS Take 1 tablet (4 mg total) by mouth every 6 (six) hours as needed. 28 each 5  . famotidine (PEPCID) 20 MG tablet TAKE 1 TABLET BY MOUTH TWICE A DAY 50 tablet 2  . L-Lysine 1000 MG TABS Take 1 tablet by mouth daily.    . montelukast (SINGULAIR) 10 MG tablet TAKE 1 TABLET BY MOUTH EVERYDAY AT BEDTIME 90  tablet 0  . multivitamin (THERAGRAN) per tablet Take 1 tablet by mouth daily.    Marland Kitchen oxybutynin (DITROPAN XL) 15 MG 24 hr tablet Take 1 tablet by mouth daily.  3  . Semaglutide-Weight Management (WEGOVY) 1 MG/0.5ML  SOAJ Inject into the skin.    Marland Kitchen sertraline (ZOLOFT) 50 MG tablet Take 1 tablet by mouth daily.  2  . SYMBICORT 160-4.5 MCG/ACT inhaler TAKE 2 PUFFS BY MOUTH TWICE A DAY 1 each 5  . testosterone cypionate (DEPOTESTOSTERONE CYPIONATE) 200 MG/ML injection Depo-Testosterone 200 mg/mL intramuscular oil  Given:    GM  Pt tolerated well. Next injection July 30. cw    . VAGIFEM 10 MCG TABS vaginal tablet Place 1 tablet vaginally 2 (two) times a week.    Marland Kitchen acyclovir ointment (ZOVIRAX) 5 % acyclovir 5 % topical ointment  COVER ALL ORAL LESIONS EVERY 3 HOURS X7 DAYS (Patient not taking: Reported on 08/21/2020)    . azelastine (ASTELIN) 0.1 % nasal spray Place 1 spray into both nostrils 2 (two) times daily as needed. (Patient not taking: Reported on 08/21/2020)    . PAZEO 0.7 % SOLN PLACE 1 DROP INTO BOTH EYES DAILY AS NEEDED. (Patient not taking: Reported on 08/21/2020) 2.5 mL 1  . valACYclovir (VALTREX) 500 MG tablet Take by mouth. (Patient not taking: Reported on 08/21/2020)     No current facility-administered medications for this visit.    Allergies  Allergen Reactions  . Sulfa Antibiotics Rash  . Doxycycline Rash  . Sulfur Rash    I appreciate the opportunity to take part in [name]'s care. Please do not hesitate to contact me with questions.  Sincerely,   R. Jorene Guest, MD

## 2020-09-26 ENCOUNTER — Other Ambulatory Visit: Payer: Self-pay | Admitting: Family Medicine

## 2020-09-28 ENCOUNTER — Other Ambulatory Visit: Payer: Self-pay | Admitting: Allergy and Immunology

## 2020-09-29 ENCOUNTER — Telehealth: Payer: Self-pay

## 2020-09-29 MED ORDER — MONTELUKAST SODIUM 10 MG PO TABS: ORAL_TABLET | ORAL | 3 refills | Status: DC

## 2020-09-29 NOTE — Telephone Encounter (Signed)
It is ok to send in a prescription for Singulair 10 mg once day quantity 30 with 3 refills. Please let her know that this might not help the drainage unless the drainage is caused by her allergies. Make sure that she is using her azelastine nasal spray and sinus rinse.  Also, please let her know:  that rarely some children/adults can experience behavioral changes after beginning montelukast. These side effects are rare, however, if you notice any change, notify the clinic and discontinue montelukast.  Thank you! Wynona Canes

## 2020-09-29 NOTE — Telephone Encounter (Signed)
Patient asked for singular due to drainage and testing positive for Covid on January 7. Dr.Bobbitt took patient off and she'd like to have a new prescription due to the drainage. Patient has been taking mucinex for the drainage. Patient says the mucinex has not helped much. Please advise if patient should get a new prescription of singular for breakthrough symptoms.

## 2020-09-29 NOTE — Telephone Encounter (Signed)
Called and left a message for patient to call the office back to talk about her singular refill. Per Dr. Sheran Fava last note it looked to be that the Singular was stopped wanted to confirm that with patient and wanted to make she didn't ask for the refill.

## 2020-09-29 NOTE — Addendum Note (Signed)
Addended by: Maryjean Morn D on: 09/29/2020 02:21 PM   Modules accepted: Orders

## 2020-09-29 NOTE — Telephone Encounter (Signed)
Pt notified. Med sent to [facility].

## 2020-11-20 ENCOUNTER — Ambulatory Visit: Payer: BC Managed Care – PPO | Admitting: Family Medicine

## 2020-12-31 ENCOUNTER — Other Ambulatory Visit: Payer: Self-pay | Admitting: Family

## 2021-05-21 NOTE — Progress Notes (Signed)
Cardiology Office Note:    Date:  05/22/2021   ID:  Jessica Bradford, DOB 1965-12-21, MRN [id number]  PCP:  [name], NP   [facility] Providers Cardiologist:  Mertie Moores, MD     Referring MD: [name], NP   Chief Complaint:  F/u for high chol    Patient Profile:   Jessica Bradford is a 55 y.o. female with:  Hyperlipidemia  FHx of CAD MVP Palpitations NSR, no AFib or arrhythmia Inc RBBB Asthma Dizziness Orthostatic hypotension   Bradycardia   Prior CV studies: NON-TELEMETRY MONITORING-INTERPRETATION ONLY 04/19/2019 Narrative  Sinus rhythm including sinus tachycardia and sinus bradycardia  No atrial fib  No serious arrhythmias  ECHOCARDIOGRAM 03/08/2019 EF 60-65, normal RVSF    History of Present Illness: Jessica Bradford was last seen by Dr. Acie Fredrickson in 10/21.  She returns for f/u.  She is here alone.  She exercises on a regular basis.  She has not had chest pain, shortness of breath, syncope, orthopnea, leg edema.  She has had issues with low blood pressures in the past.  She drinks an energy drink during her workouts which has helped prevent dizziness.        Past Medical History:  Diagnosis Date   Anemia    Anxiety    Asthma 2020   Heart palpitations    History of chicken pox    Hyperlipidemia    Incomplete right bundle branch block    Mitral valve prolapse    Urinary incontinence    Current Medications: Current Meds  Medication Sig   acyclovir ointment (ZOVIRAX) 5 % Apply 1 application topically as needed (for cold sore).   albuterol (PROAIR HFA) 108 (90 Base) MCG/ACT inhaler TAKE 2 PUFFS BY MOUTH EVERY 6 HOURS AS NEEDED FOR WHEEZE OR SHORTNESS OF BREATH   ALPRAZolam (XANAX) 0.25 MG tablet Take 0.5 tablets by mouth daily as needed.   famotidine (PEPCID) 20 MG tablet TAKE 1 TABLET BY MOUTH TWICE A DAY   L-Lysine 1000 MG TABS Take 1 tablet by mouth daily.   multivitamin (THERAGRAN) per tablet Take 1 tablet by mouth daily.    oxybutynin (DITROPAN XL) 15 MG 24 hr tablet Take 1 tablet by mouth daily.   sertraline (ZOLOFT) 50 MG tablet Take 1 tablet by mouth daily.   SYMBICORT 80-4.5 MCG/ACT inhaler SMARTSIG:2 Puff(s) By Mouth Twice Daily   VAGIFEM 10 MCG TABS vaginal tablet Place 1 tablet vaginally 2 (two) times a week.   valACYclovir (VALTREX) 500 MG tablet Take 500 mg by mouth as needed (for blisters).   WEGOVY 2.4 MG/0.75ML SOAJ SMARTSIG:2.4 Milligram(s) SUB-Q Once a Week   [DISCONTINUED] aspirin 81 MG tablet Take 81 mg by mouth daily.   [DISCONTINUED] atorvastatin (LIPITOR) 80 MG tablet Take 1 tablet (80 mg total) by mouth daily.    Allergies:   Sulfa antibiotics, Doxycycline, and Elemental sulfur   Social History   Tobacco Use   Smoking status: Never   Smokeless tobacco: Never  Vaping Use   Vaping Use: Never used  Substance Use Topics   Alcohol use: Yes    Comment: occ   Drug use: No    Family Hx: The patient's family history includes Alcohol abuse in her brother, brother, father, and sister; Cancer (age of onset: 67) in her paternal grandfather; Dementia in her paternal grandmother; Drug abuse in her brother and brother; Heart attack in her brother and mother; Heart disease in her maternal grandfather and maternal grandmother; Hypertension in  her father and paternal grandmother; Other in her paternal grandfather; Stroke in her father.  Review of Systems  Cardiovascular:  Negative for claudication.    EKGs/Labs/Other Test Reviewed:    EKG:  EKG is   ordered today.  The ekg ordered today demonstrates NSR, HR 73, left axis deviation, incomplete right bundle branch block, no ST-T wave changes, QTC 458, similar to prior tracings  Recent Labs: 05/26/2020: ALT 22; BUN 17; Creatinine, Ser 1.01; Potassium 4.3; Sodium 142   Recent Lipid Panel Lab Results  Component Value Date/Time   CHOL 132 05/26/2020 10:42 AM   CHOL 136 07/11/2013 09:25 AM   TRIG 51 05/26/2020 10:42 AM   TRIG 96 07/11/2013 09:25 AM    HDL 59 05/26/2020 10:42 AM   HDL 57 07/11/2013 09:25 AM   LDLCALC 62 05/26/2020 10:42 AM   LDLCALC 60 07/11/2013 09:25 AM     Risk Assessment/Calculations:          Physical Exam:    VS:  BP 90/60   Pulse 73   Ht _0  (1.676 m)   Wt 199 lb 3.2 oz (90.4 kg)   LMP 09/23/2017 (Exact Date)   SpO2 98%   BMI 32.15 kg/m     Wt Readings from Last 3 Encounters:  05/22/21 199 lb 3.2 oz (90.4 kg)  05/26/20 228 lb (103.4 kg)  03/25/20 225 lb 9.6 oz (102.3 kg)    Constitutional:      Appearance: Healthy appearance. Not in distress.  Neck:     Vascular: JVD normal.  Pulmonary:     Effort: Pulmonary effort is normal.     Breath sounds: No wheezing. No rales.  Cardiovascular:     Normal rate. Regular rhythm. Normal S1. Normal S2.      Murmurs: There is no murmur.  Edema:    Peripheral edema absent.  Abdominal:     Palpations: Abdomen is soft. There is no hepatomegaly.  Skin:    General: Skin is warm and dry.  Neurological:     Mental Status: Alert and oriented to person, place and time.     Cranial Nerves: Cranial nerves are intact.     ASSESSMENT & PLAN:   1. Mixed hyperlipidemia Lipids have been optimal in the past.  Continue atorvastatin 80 mg daily.  Obtain CMET, lipids today.  Follow-up in 1 year.  2. Family history of early CAD We had a discussion regarding aspirin therapy for primary prevention.  I have recommended that she discontinue aspirin as long as she feels comfortable doing this.  Her blood pressure is well controlled.  She has had some issues with low blood pressures in the past.  I advised her to use an increased salt load whenever her pressure is low and she is symptomatic.  She does not smoke.  She exercises on a regular basis.  Overall, she is doing a great job at reducing her future risk.        Dispo:  Return in about 1 year (around 05/22/2022) for Routine Follow Up, w/ Dr. Acie Fredrickson.   Medication Adjustments/Labs and Tests Ordered: Current medicines are  reviewed at length with the patient today.  Concerns regarding medicines are outlined above.  Tests Ordered: Orders Placed This Encounter  Procedures   Comp Met (CMET)   Lipid Profile   EKG 12-Lead    Medication Changes: Meds ordered this encounter  Medications   atorvastatin (LIPITOR) 80 MG tablet    Sig: Take 1 tablet (80 mg total)  by mouth daily.    Dispense:  90 tablet    Refill:  3    Signed, Richardson Dopp, PA-C  05/22/2021 9:58 AM    [facility] [place], [place], [place]  [address] Phone: [phone]; Fax: [phone]

## 2021-05-22 ENCOUNTER — Other Ambulatory Visit: Payer: Self-pay

## 2021-05-22 ENCOUNTER — Ambulatory Visit: Payer: BC Managed Care – PPO | Admitting: Physician Assistant

## 2021-05-22 ENCOUNTER — Encounter: Payer: Self-pay | Admitting: Physician Assistant

## 2021-05-22 VITALS — BP 90/60 | HR 73 | Ht 66.0 in | Wt 199.2 lb

## 2021-05-22 DIAGNOSIS — R001 Bradycardia, unspecified: Secondary | ICD-10-CM

## 2021-05-22 DIAGNOSIS — I341 Nonrheumatic mitral (valve) prolapse: Secondary | ICD-10-CM

## 2021-05-22 DIAGNOSIS — E782 Mixed hyperlipidemia: Secondary | ICD-10-CM | POA: Diagnosis not present

## 2021-05-22 DIAGNOSIS — Z8249 Family history of ischemic heart disease and other diseases of the circulatory system: Secondary | ICD-10-CM

## 2021-05-22 DIAGNOSIS — I951 Orthostatic hypotension: Secondary | ICD-10-CM

## 2021-05-22 LAB — COMPREHENSIVE METABOLIC PANEL
ALT: 24 IU/L (ref 0–32)
AST: 24 IU/L (ref 0–40)
Albumin/Globulin Ratio: 2.4 — ABNORMAL HIGH (ref 1.2–2.2)
Albumin: 4.4 g/dL (ref 3.8–4.9)
Alkaline Phosphatase: 65 IU/L (ref 44–121)
BUN/Creatinine Ratio: 18 (ref 9–23)
BUN: 17 mg/dL (ref 6–24)
Bilirubin Total: 1.1 mg/dL (ref 0.0–1.2)
CO2: 26 mmol/L (ref 20–29)
Calcium: 9.6 mg/dL (ref 8.7–10.2)
Chloride: 104 mmol/L (ref 96–106)
Creatinine, Ser: 0.95 mg/dL (ref 0.57–1.00)
Globulin, Total: 1.8 g/dL (ref 1.5–4.5)
Glucose: 90 mg/dL (ref 70–99)
Potassium: 4.9 mmol/L (ref 3.5–5.2)
Sodium: 141 mmol/L (ref 134–144)
Total Protein: 6.2 g/dL (ref 6.0–8.5)
eGFR: 70 mL/min/{1.73_m2} (ref >59)

## 2021-05-22 LAB — LIPID PANEL
Chol/HDL Ratio: 2.3 ratio (ref 0.0–4.4)
Cholesterol, Total: 138 mg/dL (ref 100–199)
HDL: 60 mg/dL (ref >39)
LDL Chol Calc (NIH): 65 mg/dL (ref 0–99)
Triglycerides: 60 mg/dL (ref 0–149)
VLDL Cholesterol Cal: 13 mg/dL (ref 5–40)

## 2021-05-22 MED ORDER — ATORVASTATIN CALCIUM 80 MG PO TABS: 80.0000 mg | ORAL_TABLET | Freq: Every day | ORAL | 3 refills | Status: DC

## 2021-05-22 NOTE — Patient Instructions (Signed)
Medication Instructions:   DISCONTINUE Asprin  *If you need a refill on your cardiac medications before your next appointment, please call your pharmacy*  Lab Work: TODAY!!!! CMET/LIPID  If you have labs (blood work) drawn today and your tests are completely normal, you will receive your results only by: MyChart Message (if you have MyChart) OR A paper copy in the mail If you have any lab test that is abnormal or we need to change your treatment, we will call you to review the results.   Testing/Procedures:  -NONE-  Follow-Up: At [facility], you and your health needs are our priority.  As part of our continuing mission to provide you with exceptional heart care, we have created designated Provider Care Teams.  These Care Teams include your primary Cardiologist (physician) and Advanced Practice Providers (APPs -  Physician Assistants and Nurse Practitioners) who all work together to provide you with the care you need, when you need it.  We recommend signing up for the patient portal called "MyChart".  Sign up information is provided on this After Visit Summary.  MyChart is used to connect with patients for Virtual Visits (Telemedicine).  Patients are able to view lab/test results, encounter notes, upcoming appointments, etc.  Non-urgent messages can be sent to your provider as well.   To learn more about what you can do with MyChart, go to [email or web address].    Your next appointment:   1 year(s)  The format for your next appointment:   In Person  Provider:   Kristeen Miss, MD   Other Instructions Your physician wants you to follow-up in: 1 year with Dr. Elease Hashimoto.  You will receive a reminder letter in the mail two months in advance. If you don't receive a letter, please call our office to schedule the follow-up appointment.

## 2021-06-03 ENCOUNTER — Other Ambulatory Visit: Payer: Self-pay

## 2021-06-05 ENCOUNTER — Other Ambulatory Visit: Payer: Self-pay | Admitting: *Deleted

## 2021-06-09 ENCOUNTER — Other Ambulatory Visit: Payer: Self-pay

## 2022-05-19 ENCOUNTER — Other Ambulatory Visit: Payer: Self-pay | Admitting: Physician Assistant

## 2022-06-15 ENCOUNTER — Ambulatory Visit: Payer: BC Managed Care – PPO | Admitting: Cardiovascular Disease

## 2022-06-18 ENCOUNTER — Encounter: Payer: Self-pay | Admitting: Cardiovascular Disease

## 2022-06-20 ENCOUNTER — Encounter: Payer: Self-pay | Admitting: Cardiovascular Disease

## 2022-06-20 NOTE — Progress Notes (Unsigned)
Cardiology Office Note   Date:  06/21/2022   ID:  Jessica, Bradford [birth year], MRN [id number]  PCP:  [name], NP  Cardiologist:   Mertie Moores, MD   1. Hyperliidemia 2. Dizziness    Previous notes:  Jessica Bradford is a 56 year old female with a history of hypercholesterolemia. She has a very strong family history of coronary artery disease. She's done very well since she last saw Dr. Doreatha Lew last year. She walks 4-6 miles every day.  She denies any chest pain or shortness of breath with her walking.  She has a history of mitral valve prolapse. She has occasional palpitations.  These  typically occur when she is at rest.      She is recovering from an episode of cellulitis. She apparently stepped on a splinter on her deck and developed cellulitis in her foot.   She was on antibiotics for several weeks. She is still not exercising because of some persistent foot pain and swelling.  Nov. 19, 2014:  No CP, no dyspnea,  Still walking 5+ miles a day.  Also doing strength training.    She gets a discount from her insurance plan to work out    Dec 23, 2014:  Jessica Bradford is a 56 y.o. female who presents for  Follow up of her hyperlipidemia Walks regularly,  Also does strength training.  Trying to watch her diet.   Jan 07, 2016: Doing well  Has had several episodes of waking up "thinking she was not breathing " Snores some but does not have apneic episodes according to husband  Exercising some .   [place] twice a week , walks the other days  Has had difficulty losing weight .   January 26, 2017:  Jessica Bradford is doing well from a cardiac standpoint Is very fatigued for the past 6 months . Is tired all of the time  Takes naps frequently  Still working out at [employer] - works outs are going ok .   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    Is losing some hair ( which is some  concern)   March 02, 2019 :  Jessica Bradford is see today for episodes of dizziness.  Hx of hyperlipidemia  3 times over the past 7 weeks. Near syncope.   Feels fine and then has palpitations,  Legs feel like jello and has near syncope  One episode happened at [facility],  She bought a BP cuff,  BP was 95/64 when she got home Has not occurred since June 27.  Drinks water  All have occurred as she is standing up  From a seated position .  2 episodes at lunch   Breakfast - oatmeal  Or toast, yogart Lunch - salad or sandwich Dinner - frequently does not have meat .    Echo in 2012 was normal.  Has worn a monitor about 10 years ago    Oct. 4, 2021: Jessica Bradford is seen today for follow up of her hyperlipidemia and dizziness Has had near syncope  Had episodes of "legs feel like jello"  Event monitor - revealed sinus rhythm including sinus tach and sinus brady.   No serious arrhythmias seen Echo - July 2020 revealed trace MR, trae TR, normal LV function Has had 2 episodes of near syncope.    Has been working out with a personal  trainer who noted that her HR was slow Her HR during exercise goes up to the 90s  Oct. 30, 2023 Jessica Bradford is seen for follow up of her HLD and dizziness. Hx of MVP / MR   Has been under lots of stress at work  [employer] like she cannot take a deep breath  Has been limited but a knee injury   No limitations with breathing when she is walking .   No PND or orthopnea   Works at [employer]  ( [employer] and [employer] )  [employer] is 196 lbs  Recent labs from her primary medical doctor reveal a total cholesterol of 138 Triglyceride is 75 HDL is 74 LDL is 49.  These levels look great    Past Medical History:  Diagnosis Date   Anemia    Anxiety    Asthma 2020   Heart palpitations    History of chicken pox    Hyperlipidemia    Incomplete right bundle branch block    Mitral valve prolapse    Urinary incontinence     Past Surgical History:  Procedure Laterality Date    CHOLECYSTECTOMY     TUBAL LIGATION  1996   TUBAL LIGATION       Current Outpatient Medications  Medication Sig Dispense Refill   acyclovir ointment (ZOVIRAX) 5 % Apply 1 application topically as needed (for cold sore).     albuterol (PROAIR HFA) 108 (90 Base) MCG/ACT inhaler TAKE 2 PUFFS BY MOUTH EVERY 6 HOURS AS NEEDED FOR WHEEZE OR SHORTNESS OF BREATH 8.5 g 2   ALPRAZolam (XANAX) 0.25 MG tablet Take 0.5 tablets by mouth daily as needed.  1   aspirin 81 MG chewable tablet Chew by mouth.     atorvastatin (LIPITOR) 80 MG tablet TAKE 1 TABLET BY MOUTH EVERY DAY 90 tablet 0   Budeson-Glycopyrrol-Formoterol (BREZTRI AEROSPHERE) 160-9-4.8 MCG/ACT AERO INHALE 2 PUFFS INTO THE LUNGS TWICE A DAY FOR 30 DAYS     Carbinoxamine Maleate 4 MG TABS Take 1 tablet by mouth 3 (three) times daily as needed.     famotidine (PEPCID) 20 MG tablet TAKE 1 TABLET BY MOUTH TWICE A DAY 50 tablet 2   L-Lysine 1000 MG TABS Take 1 tablet by mouth daily.     levocetirizine (XYZAL) 5 MG tablet Take by mouth.     montelukast (SINGULAIR) 10 MG tablet Take 10 mg by mouth daily.     multivitamin (THERAGRAN) per tablet Take 1 tablet by mouth daily.     oxybutynin (DITROPAN XL) 15 MG 24 hr tablet Take 1 tablet by mouth daily.  3   sertraline (ZOLOFT) 50 MG tablet Take 1 tablet by mouth daily.  2   topiramate (TOPAMAX) 25 MG tablet Take 25 mg by mouth at bedtime.     VAGIFEM 10 MCG TABS vaginal tablet Place 1 tablet vaginally 2 (two) times a week.     valACYclovir (VALTREX) 500 MG tablet Take 500 mg by mouth as needed (for blisters).     WEGOVY 2.4 MG/0.75ML SOAJ SMARTSIG:2.4 Milligram(s) SUB-Q Once a Week     SYMBICORT 80-4.5 MCG/ACT inhaler SMARTSIG:2 Puff(s) By Mouth Twice Daily (Patient not taking: Reported on 06/21/2022)     No current facility-administered medications for this visit.    Allergies:   Sulfa antibiotics, Doxycycline, and Elemental sulfur    Social History:  The patient  reports that she has never  smoked. She has never used smokeless tobacco. She reports current alcohol use. She  reports that she does not use drugs.   Family History:  The patient's family history includes Alcohol abuse in her brother, brother, father, and sister; Cancer (age of onset: 12) in her paternal grandfather; Dementia in her paternal grandmother; Drug abuse in her brother and brother; Heart attack in her brother and mother; Heart disease in her maternal grandfather and maternal grandmother; Hypertension in her father and paternal grandmother; Other in her paternal grandfather; Stroke in her father.    ROS:  Please see the history of present illness.    Physical Exam: Blood pressure 100/72, pulse 69, height 5\' 6"  (1.676 m), weight 196 lb (88.9 kg), last menstrual period 09/23/2017, SpO2 96 %.       GEN:  Well nourished, well developed in no acute distress HEENT: Normal NECK: No JVD; No carotid bruits LYMPHATICS: No lymphadenopathy CARDIAC: RRR , no murmurs, rubs, gallops RESPIRATORY:  Clear to auscultation without rales, wheezing or rhonchi  ABDOMEN: Soft, non-tender, non-distended MUSCULOSKELETAL:  No edema; No deformity  SKIN: Warm and dry NEUROLOGIC:  Alert and oriented x 3      EKG:     June 21, 2022: Normal sinus rhythm at 69.  Left axis deviation.  Recent Labs: No results found for requested labs within last 365 days.    Lipid Panel    Component Value Date/Time   CHOL 138 05/22/2021 1000   CHOL 136 07/11/2013 0925   TRIG 60 05/22/2021 1000   TRIG 96 07/11/2013 0925   HDL 60 05/22/2021 1000   HDL 57 07/11/2013 0925   CHOLHDL 2.3 05/22/2021 1000   CHOLHDL 2 11/20/2019 1530   VLDL 13.4 11/20/2019 1530   LDLCALC 65 05/22/2021 1000   LDLCALC 60 07/11/2013 0925      Wt Readings from Last 3 Encounters:  06/21/22 196 lb (88.9 kg)  05/22/21 199 lb 3.2 oz (90.4 kg)  05/26/20 228 lb (103.4 kg)      Other studies Reviewed: Additional studies/ records that were reviewed today  include: . Review of the above records demonstrates:   ASSESSMENT AND PLAN:  1.  Inability to take a deep breath: Shanon Brow presents for further evaluation of her issues regarding inability to take a deep breath.  I think that this is anxiety.  She is able to ambulate without any difficulty.  I encouraged her to continue to work out.  Continue weight loss efforts..  2.  Hyperliidemia-      3.  Sinus bradycardia:        4.  Systolic murmur:    She has a soft mitral valve murmur.   Current medicines are reviewed at length with the patient today.  The patient does not have concerns regarding medicines.  The following changes have been made:  no change  Labs/ tests ordered today include:   No orders of the defined types were placed in this encounter.     Disposition:   FU with me in 1 year     Mertie Moores, MD  06/21/2022 3:52 PM    [facility] [place], [place], [place]  [address] Phone: [phone]; Fax: [phone]

## 2022-06-21 ENCOUNTER — Ambulatory Visit: Payer: BC Managed Care – PPO | Attending: Cardiovascular Disease | Admitting: Cardiovascular Disease

## 2022-06-21 ENCOUNTER — Encounter: Payer: Self-pay | Admitting: Cardiovascular Disease

## 2022-06-21 VITALS — BP 100/72 | HR 69 | Ht 66.0 in | Wt 196.0 lb

## 2022-06-21 DIAGNOSIS — I340 Nonrheumatic mitral (valve) insufficiency: Secondary | ICD-10-CM | POA: Diagnosis not present

## 2022-06-21 NOTE — Patient Instructions (Signed)
Medication Instructions:  Your physician recommends that you continue on your current medications as directed. Please refer to the Current Medication list given to you today.  *If you need a refill on your cardiac medications before your next appointment, please call your pharmacy*   Lab Work: NONE If you have labs (blood work) drawn today and your tests are completely normal, you will receive your results only by: MyChart Message (if you have MyChart) OR A paper copy in the mail If you have any lab test that is abnormal or we need to change your treatment, we will call you to review the results.   Testing/Procedures: NONE   Follow-Up: At [facility], you and your health needs are our priority.  As part of our continuing mission to provide you with exceptional heart care, we have created designated Provider Care Teams.  These Care Teams include your primary Cardiologist (physician) and Advanced Practice Providers (APPs -  Physician Assistants and Nurse Practitioners) who all work together to provide you with the care you need, when you need it.  Your next appointment:   1 year(s)  The format for your next appointment:   In Person  Provider:   Philip Nahser, MD    Important Information About Sugar       

## 2022-08-16 ENCOUNTER — Other Ambulatory Visit: Payer: Self-pay | Admitting: Physician Assistant

## 2022-09-22 NOTE — Progress Notes (Unsigned)
Follow Up Note  RE: ANYLAH SCHEIB MRN: [id number] DOB: [birth year] Date of Office Visit: 09/23/2022  Referring provider: [name], NP Primary care provider: [name], NP  Chief Complaint: No chief complaint on file.  History of Present Illness: I had the pleasure of seeing Jessica Bradford for a follow up visit at the [facility] on 09/22/2022. She is a 57 y.o. female, who is being followed for asthma and allergic rhinitis. Her previous allergy office visit was on 08/21/2020 with Dr. Verlin Fester. Today is a regular follow up visit.  2020 skin testing was positive to mold, cat and dog.  Moderate persistent asthma Well-controlled, we will stepdown therapy at this time. Discontinue montelukast.  If lower respiratory symptoms progress in frequency and/or severity, the patient is to resume this medication. Continue Symbicort 160-4.5 g, 2 inhalations via spacer device twice daily. Continue albuterol HFA, 1 to 2 inhalations every 4-6 hours if needed. Subjective and objective measures of pulmonary function will be followed and the treatment plan will be adjusted accordingly.   Perennial allergic rhinitis Continue appropriate allergen avoidance measures and carbinoxamine maleate if needed. Azelastine nasal spray, 1-2 sprays per nostril 2 times daily as needed.  Nasal saline spray (i.e., Simply Saline) or nasal saline lavage (i.e., NeilMed) is recommended as needed and prior to medicated nasal sprays. For thick post nasal drainage, add guaifenesin 1200 mg (Mucinex Maximum Strength)  twice daily as needed with adequate hydration as discussed.  Assessment and Plan: Jessica Bradford is a 57 y.o. female with: No problem-specific Assessment & Plan notes found for this encounter.  No follow-ups on file.  No orders of the defined types were placed in this encounter.  Lab Orders  No laboratory test(s) ordered today    Diagnostics: Spirometry:  Tracings reviewed. Her  effort: {Blank single:19197::"Good reproducible efforts.","It was hard to get consistent efforts and there is a question as to whether this reflects a maximal maneuver.","Poor effort, data can not be interpreted."} FVC: ***L FEV1: ***L, ***% predicted FEV1/FVC ratio: ***% Interpretation: {Blank single:19197::"Spirometry consistent with mild obstructive disease","Spirometry consistent with moderate obstructive disease","Spirometry consistent with severe obstructive disease","Spirometry consistent with possible restrictive disease","Spirometry consistent with mixed obstructive and restrictive disease","Spirometry uninterpretable due to technique","Spirometry consistent with normal pattern","No overt abnormalities noted given today's efforts"}.  Please see scanned spirometry results for details.  Skin Testing: {Blank single:19197::"Select foods","Environmental allergy panel","Environmental allergy panel and select foods","Food allergy panel","None","Deferred due to recent antihistamines use"}. *** Results discussed with patient/family.   Medication List:  Current Outpatient Medications  Medication Sig Dispense Refill   acyclovir ointment (ZOVIRAX) 5 % Apply 1 application topically as needed (for cold sore).     albuterol (PROAIR HFA) 108 (90 Base) MCG/ACT inhaler TAKE 2 PUFFS BY MOUTH EVERY 6 HOURS AS NEEDED FOR WHEEZE OR SHORTNESS OF BREATH 8.5 g 2   ALPRAZolam (XANAX) 0.25 MG tablet Take 0.5 tablets by mouth daily as needed.  1   aspirin 81 MG chewable tablet Chew by mouth.     atorvastatin (LIPITOR) 80 MG tablet TAKE 1 TABLET BY MOUTH EVERY DAY 90 tablet 2   Budeson-Glycopyrrol-Formoterol (BREZTRI AEROSPHERE) 160-9-4.8 MCG/ACT AERO INHALE 2 PUFFS INTO THE LUNGS TWICE A DAY FOR 30 DAYS     Carbinoxamine Maleate 4 MG TABS Take 1 tablet by mouth 3 (three) times daily as needed.     famotidine (PEPCID) 20 MG tablet TAKE 1 TABLET BY MOUTH TWICE A DAY 50 tablet 2   L-Lysine 1000 MG  TABS Take 1  tablet by mouth daily.     levocetirizine (XYZAL) 5 MG tablet Take by mouth.     montelukast (SINGULAIR) 10 MG tablet Take 10 mg by mouth daily.     multivitamin (THERAGRAN) per tablet Take 1 tablet by mouth daily.     oxybutynin (DITROPAN XL) 15 MG 24 hr tablet Take 1 tablet by mouth daily.  3   sertraline (ZOLOFT) 50 MG tablet Take 1 tablet by mouth daily.  2   SYMBICORT 80-4.5 MCG/ACT inhaler SMARTSIG:2 Puff(s) By Mouth Twice Daily (Patient not taking: Reported on 06/21/2022)     topiramate (TOPAMAX) 25 MG tablet Take 25 mg by mouth at bedtime.     VAGIFEM 10 MCG TABS vaginal tablet Place 1 tablet vaginally 2 (two) times a week.     valACYclovir (VALTREX) 500 MG tablet Take 500 mg by mouth as needed (for blisters).     WEGOVY 2.4 MG/0.75ML SOAJ SMARTSIG:2.4 Milligram(s) SUB-Q Once a Week     No current facility-administered medications for this visit.   Allergies: Allergies  Allergen Reactions   Sulfa Antibiotics Rash   Doxycycline Rash and Other (See Comments)    Other reaction(s): vomiting   Elemental Sulfur Rash   I reviewed her past medical history, social history, family history, and environmental history and no significant changes have been reported from her previous visit.  Review of Systems  Constitutional:  Negative for appetite change, chills, fever and unexpected weight change.  HENT:  Negative for congestion and rhinorrhea.   Eyes:  Negative for itching.  Respiratory:  Negative for cough, chest tightness, shortness of breath and wheezing.   Gastrointestinal:  Negative for abdominal pain.  Skin:  Negative for rash.  Allergic/Immunologic: Positive for environmental allergies.  Neurological:  Negative for headaches.    Objective: LMP 09/23/2017 (Exact Date)  There is no height or weight on file to calculate BMI. Physical Exam Vitals and nursing note reviewed.  Constitutional:      Appearance: Normal appearance. She is well-developed.  HENT:     Head:  Normocephalic and atraumatic.     Right Ear: Tympanic membrane and external ear normal.     Left Ear: Tympanic membrane and external ear normal.     Nose: Nose normal.     Mouth/Throat:     Mouth: Mucous membranes are moist.     Pharynx: Oropharynx is clear.  Eyes:     Conjunctiva/sclera: Conjunctivae normal.  Cardiovascular:     Rate and Rhythm: Normal rate and regular rhythm.     Heart sounds: Normal heart sounds. No murmur heard. Pulmonary:     Effort: Pulmonary effort is normal.     Breath sounds: Normal breath sounds. No wheezing, rhonchi or rales.  Musculoskeletal:     Cervical back: Neck supple.  Skin:    General: Skin is warm.     Findings: No rash.  Neurological:     Mental Status: She is alert and oriented to person, place, and time.  Psychiatric:        Behavior: Behavior normal.    Previous notes and tests were reviewed. The plan was reviewed with the patient/family, and all questions/concerned were addressed.  It was my pleasure to see Anitra today and participate in her care. Please feel free to contact me with any questions or concerns.  Sincerely,  Rexene Alberts, DO Allergy & Immunology  [facility] office: [place] office: [phone]

## 2022-09-23 ENCOUNTER — Encounter: Payer: Self-pay | Admitting: Allergy

## 2022-09-23 ENCOUNTER — Other Ambulatory Visit: Payer: Self-pay | Admitting: Allergy

## 2022-09-23 ENCOUNTER — Ambulatory Visit: Payer: No Typology Code available for payment source | Admitting: Allergy

## 2022-09-23 VITALS — BP 90/60 | HR 71 | Temp 98.1°F | Resp 16 | Ht 66.0 in | Wt 196.0 lb

## 2022-09-23 DIAGNOSIS — J3089 Other allergic rhinitis: Secondary | ICD-10-CM

## 2022-09-23 DIAGNOSIS — J4540 Moderate persistent asthma, uncomplicated: Secondary | ICD-10-CM | POA: Diagnosis not present

## 2022-09-23 MED ORDER — FLUTICASONE PROPIONATE HFA 110 MCG/ACT IN AERO: 2.0000 | INHALATION_SPRAY | Freq: Two times a day (BID) | RESPIRATORY_TRACT | 5 refills | Status: DC

## 2022-09-23 MED ORDER — MONTELUKAST SODIUM 10 MG PO TABS: 10.0000 mg | ORAL_TABLET | Freq: Every day | ORAL | 2 refills | Status: DC

## 2022-09-23 MED ORDER — PULMICORT FLEXHALER 90 MCG/ACT IN AEPB: 1.0000 | INHALATION_SPRAY | Freq: Two times a day (BID) | RESPIRATORY_TRACT | 5 refills | Status: DC

## 2022-09-23 NOTE — Assessment & Plan Note (Signed)
Past history - 2020 skin testing was positive to mold, cat and dog. Steroid nasal sprays cause epistaxis. Interim history - controlled with Singulair daily. See below for environmental control measures. Continue Singulair (montelukast) 10mg  daily at night. Use over the counter antihistamines such as Zyrtec (cetirizine), Claritin (loratadine), Allegra (fexofenadine), or Xyzal (levocetirizine) daily as needed. May take twice a day during allergy flares. May switch antihistamines every few months. Use olopatadine eye drops 0.2% once a day as needed for itchy/watery eyes. May use saline nasal spray as needed.

## 2022-09-23 NOTE — Patient Instructions (Addendum)
Asthma  Normal breathing test today. Daily controller medication(s): continue Flovent 13mcg 2 puffs twice a day with spacer and rinse mouth afterwards. Let me know if it's not covered.  If you notice that you need to get prednisone when your asthma flares then we may add on a stronger inhaler when you are having a respiratory infection or asthma flares.  May use albuterol rescue inhaler 2 puffs every 4 to 6 hours as needed for shortness of breath, chest tightness, coughing, and wheezing.  Monitor frequency of use.  Breathing control goals:  Full participation in all desired activities (may need albuterol before activity) Albuterol use two times or less a week on average (not counting use with activity) Cough interfering with sleep two times or less a month Oral steroids no more than once a year No hospitalizations   Perennial allergic rhinitis 2020 skin testing was positive to mold, cat and dog. See below for environmental control measures. Continue Singulair (montelukast) 10mg  daily at night. Use over the counter antihistamines such as Zyrtec (cetirizine), Claritin (loratadine), Allegra (fexofenadine), or Xyzal (levocetirizine) daily as needed. May take twice a day during allergy flares. May switch antihistamines every few months. Use olopatadine eye drops 0.2% once a day as needed for itchy/watery eyes. May use saline nasal spray as needed.  Follow up in 6 months or sooner if needed.   Pet Allergen Avoidance: Contrary to popular opinion, there are no "hypoallergenic" breeds of dogs or cats. That is because people are not allergic to an animal's hair, but to an allergen found in the animal's saliva, dander (dead skin flakes) or urine. Pet allergy symptoms typically occur within minutes. For some people, symptoms can build up and become most severe 8 to 12 hours after contact with the animal. People with severe allergies can experience reactions in public places if dander has been  transported on the pet owners' clothing. Keeping an animal outdoors is only a partial solution, since homes with pets in the yard still have higher concentrations of animal allergens. Before getting a pet, ask your allergist to determine if you are allergic to animals. If your pet is already considered part of your family, try to minimize contact and keep the pet out of the bedroom and other rooms where you spend a great deal of time. As with dust mites, vacuum carpets often or replace carpet with a hardwood floor, tile or linoleum. High-efficiency particulate air (HEPA) cleaners can reduce allergen levels over time. While dander and saliva are the source of cat and dog allergens, urine is the source of allergens from rabbits, hamsters, mice and [place] pigs; so ask a non-allergic family member to clean the animal's cage. If you have a pet allergy, talk to your allergist about the potential for allergy immunotherapy (allergy shots). This strategy can often provide long-term relief. Mold Control Mold and fungi can grow on a variety of surfaces provided certain temperature and moisture conditions exist.  Outdoor molds grow on plants, decaying vegetation and soil. The major outdoor mold, Alternaria and Cladosporium, are found in very high numbers during hot and dry conditions. Generally, a late summer - fall peak is seen for common outdoor fungal spores. Rain will temporarily lower outdoor mold spore count, but counts rise rapidly when the rainy period ends. The most important indoor molds are Aspergillus and Penicillium. Dark, humid and poorly ventilated basements are ideal sites for mold growth. The next most common sites of mold growth are the bathroom and the kitchen. Outdoor (Seasonal)  Mold Control Use air conditioning and keep windows closed. Avoid exposure to decaying vegetation. Avoid leaf raking. Avoid grain handling. Consider wearing a face mask if working in moldy areas.  Indoor (Perennial)  Mold Control  Maintain humidity below 50%. Get rid of mold growth on hard surfaces with water, detergent and, if necessary, 5% bleach (do not mix with other cleaners). Then dry the area completely. If mold covers an area more than 10 square feet, consider hiring an indoor environmental professional. For clothing, washing with soap and water is best. If moldy items cannot be cleaned and dried, throw them away. Remove sources e.g. contaminated carpets. Repair and seal leaking roofs or pipes. Using dehumidifiers in damp basements may be helpful, but empty the water and clean units regularly to prevent mildew from forming. All rooms, especially basements, bathrooms and kitchens, require ventilation and cleaning to deter mold and mildew growth. Avoid carpeting on concrete or damp floors, and storing items in damp areas.

## 2022-09-23 NOTE — Assessment & Plan Note (Signed)
Doing well with Flovent 133mcg 2 puffs Bid and Singulair daily. Used to be on Symbicort in the past. 1 prednisone course last year.  Today's spirometry was normal. Daily controller medication(s): continue Flovent (generic) 18mcg 2 puffs twice a day with spacer and rinse mouth afterwards. Let me know if it's not covered.  If you notice that you need to get prednisone when your asthma flares then we may add on a stronger inhaler when you are having a respiratory infection or asthma flares.  May use albuterol rescue inhaler 2 puffs every 4 to 6 hours as needed for shortness of breath, chest tightness, coughing, and wheezing.  Monitor frequency of use.  Get spirometry at next visit.

## 2022-09-23 NOTE — Progress Notes (Signed)
Generic Flovent not covered.  Sent in Pulmicort 16mcg 1-2 puff twice a day.

## 2022-09-24 NOTE — Telephone Encounter (Signed)
Please advise as dr Maudie Mercury is out on [facility] pts insurance will not cover flovent please advise to pulmicort

## 2022-09-24 NOTE — Telephone Encounter (Signed)
She has a phone note from yesterday stating she changed to [place].   "Sent in Pulmicort 42mcg 1-2 puff twice a day."

## 2022-12-27 ENCOUNTER — Telehealth: Payer: Self-pay | Admitting: Cardiovascular Disease

## 2022-12-27 DIAGNOSIS — R0789 Other chest pain: Secondary | ICD-10-CM

## 2022-12-27 DIAGNOSIS — R071 Chest pain on breathing: Secondary | ICD-10-CM

## 2022-12-27 DIAGNOSIS — R0602 Shortness of breath: Secondary | ICD-10-CM

## 2022-12-27 DIAGNOSIS — Z01810 Encounter for preprocedural cardiovascular examination: Secondary | ICD-10-CM

## 2022-12-27 NOTE — Telephone Encounter (Signed)
Pt c/o Shortness Of Breath: STAT if SOB developed within the last 24 hours or pt is noticeably SOB on the phone  1. Are you currently SOB (can you hear that pt is SOB on the phone)? No  2. How long have you been experiencing SOB? Month or so  3. Are you SOB when sitting or when up moving around? Randomly, not based upon moving or sitting  4. Are you currently experiencing any other symptoms? Pt feels like she can't get a deep breath. Patient stated that this happens randomly and it's been going on for about a month. Please advise.

## 2022-12-29 ENCOUNTER — Other Ambulatory Visit: Payer: Self-pay | Admitting: Cardiovascular Disease

## 2022-12-29 MED ORDER — NITROGLYCERIN 0.4 MG SL SUBL: 0.4000 mg | SUBLINGUAL_TABLET | SUBLINGUAL | 3 refills | Status: AC | PRN

## 2022-12-29 NOTE — Telephone Encounter (Signed)
Pt returned call to office and states this breating issue feels different than what she has felt in the past. States she went to PCP who didn't have specific advice. They x-ray'd her back because she states she was having some muscular pain across top of her back  that revealed noting, but she now states is better since doing stretching. She states the breathing issue is not painful at all, but states she feels like she needs to yawn, or bend her body over, to get a deep breath in. When it occurs, she does get anxious and can lay down and get herself calm. Denies any precipitating factor. States it comes and goes, and she knows it's occurring because of "a tightness in her back between shoulder blades." No vision changes, no n/v, no chest pain, no jaw pain, states she has checked vitals during these episodes and BP has been good-HR is elevated into the 90's, but she attributes this to her increased anxiety during it. Does feel like her acid reflux has worsened over the past few weeks, but also takes OTC medication for this. Does have allergies, has not seen allergist or asthma doctor yet, was suggested by PCP to start with [place] as none of her usual allergy symptoms have worsened in conjunction with these episodes.

## 2022-12-29 NOTE — Telephone Encounter (Signed)
Returned call to patient regarding her symptoms of SOB, but no answer. Left message asking that she call office back. Per last office note on 06/21/22 by Nahser: 1.  Inability to take a deep breath: Jessica Bradford presents for further evaluation of her issues regarding inability to take a deep breath.  I think that this is anxiety.  She is able to ambulate without any difficulty.  I encouraged her to continue to work out.  Continue weight loss efforts.Marland Kitchen

## 2023-01-02 ENCOUNTER — Encounter: Payer: Self-pay | Admitting: Cardiovascular Disease

## 2023-01-03 NOTE — Telephone Encounter (Signed)
Called and spoke to patient about test. She understands that she will be called to schedule it at hospital. Had labs done 2 weeks ago by PCP at [facility] (results attached below). Directions for CTA sent via MyChart. Pt states resting HR is typically in upper 50's to mid-60's, so will hold off on sending Metoprolol prior to test. (Comprehensive metabolic panel (12/16/2022 2:18 PM EDT) Lab Results - (ABNORMAL) Comprehensive metabolic panel (12/16/2022 2:18 PM EDT) Component Value Ref Range Test Method Analysis Time Performed At Pathologist Signature  Glucose 84 70 - 99 mg/dL     [facility] 1    BUN 24 6 - 24 mg/dL     [facility] 1    Creatinine 0.89 0.57 - 1.00 mg/dL     [facility] 1    eGFR 75 >59 mL/min/1.73     [facility] 1    BUN/Creatinine Ratio 27 (H) 9 - 23     [facility] 1    Sodium 142 134 - 144 mmol/L     [facility] 1    Potassium 4.6 3.5 - 5.2 mmol/L     [facility] 1    Chloride 105 96 - 106 mmol/L     [facility] 1    CO2 22 20 - 29 mmol/L     [facility] 1    CALCIUM 9.9 8.7 - 10.2 mg/dL     [facility] 1    Total Protein 6.7 6.0 - 8.5 g/dL     [facility] 1    Albumin, Serum 4.6 3.8 - 4.9 g/dL     [facility] 1    Globulin, Total 2.1 1.5 - 4.5 g/dL     [facility] 1    Albumin/Globulin Ratio 2.2 1.2 - 2.2     [facility] 1    Total Bilirubin 1.0 0.0 - 1.2 mg/dL     [facility] 1    Alkaline Phosphatase 59 44 - 121 IU/L     [facility] 1    AST 24 0 - 40 IU/L     [facility] 1    ALT (SGPT) 26 0 - 32 IU/L     [facility] 1

## 2023-01-10 ENCOUNTER — Telehealth (HOSPITAL_COMMUNITY): Payer: Self-pay | Admitting: *Deleted

## 2023-01-10 NOTE — Telephone Encounter (Signed)
Reaching out to patient to offer assistance regarding upcoming cardiac imaging study; pt verbalizes understanding of appt date/time, parking situation and where to check in, pre-test NPO status, and verified current allergies; name and call back number provided for further questions should they arise  Daisy Lites RN Navigator Cardiac Imaging [facility] [phone] office [phone] cell  Patient aware to arrive at 3pm. 

## 2023-01-11 ENCOUNTER — Ambulatory Visit (HOSPITAL_COMMUNITY)
Admission: RE | Admit: 2023-01-11 | Discharge: 2023-01-11 | Disposition: A | Discharge status: Home / Self Care | Payer: No Typology Code available for payment source | Source: Ambulatory Visit | Attending: Cardiovascular Disease | Admitting: Cardiovascular Disease

## 2023-01-11 DIAGNOSIS — R071 Chest pain on breathing: Secondary | ICD-10-CM | POA: Diagnosis present

## 2023-01-11 DIAGNOSIS — R072 Precordial pain: Secondary | ICD-10-CM

## 2023-01-11 MED ORDER — NITROGLYCERIN 0.4 MG SL SUBL
0.8000 mg | SUBLINGUAL_TABLET | SUBLINGUAL | Status: DC | PRN
  Administered 2023-01-11: 0.8 mg via SUBLINGUAL

## 2023-01-11 MED ORDER — IOHEXOL 350 MG/ML SOLN
95.0000 mL | Freq: Once | INTRAVENOUS | Status: AC | PRN
  Administered 2023-01-11: 95 mL via INTRAVENOUS

## 2023-01-11 MED ORDER — NITROGLYCERIN 0.4 MG SL SUBL
SUBLINGUAL_TABLET | SUBLINGUAL | Status: AC
  Filled 2023-01-11: qty 2

## 2023-01-11 NOTE — Progress Notes (Signed)
Patient tolerated without distress 

## 2023-01-20 ENCOUNTER — Ambulatory Visit
Payer: No Typology Code available for payment source | Attending: Cardiovascular Disease | Admitting: Cardiovascular Disease

## 2023-01-20 ENCOUNTER — Encounter: Payer: Self-pay | Admitting: Cardiovascular Disease

## 2023-01-20 VITALS — BP 102/66 | HR 65 | Ht 65.5 in | Wt 187.2 lb

## 2023-01-20 DIAGNOSIS — I2510 Atherosclerotic heart disease of native coronary artery without angina pectoris: Secondary | ICD-10-CM | POA: Diagnosis not present

## 2023-01-20 DIAGNOSIS — I340 Nonrheumatic mitral (valve) insufficiency: Secondary | ICD-10-CM

## 2023-01-20 DIAGNOSIS — I2584 Coronary atherosclerosis due to calcified coronary lesion: Secondary | ICD-10-CM | POA: Diagnosis not present

## 2023-01-20 NOTE — Patient Instructions (Signed)
Medication Instructions:  Your physician recommends that you continue on your current medications as directed. Please refer to the Current Medication list given to you today.  *If you need a refill on your cardiac medications before your next appointment, please call your pharmacy*   Lab Work: NONE If you have labs (blood work) drawn today and your tests are completely normal, you will receive your results only by: MyChart Message (if you have MyChart) OR A paper copy in the mail If you have any lab test that is abnormal or we need to change your treatment, we will call you to review the results.   Testing/Procedures: NONE   Follow-Up: At [facility], you and your health needs are our priority.  As part of our continuing mission to provide you with exceptional heart care, we have created designated Provider Care Teams.  These Care Teams include your primary Cardiologist (physician) and Advanced Practice Providers (APPs -  Physician Assistants and Nurse Practitioners) who all work together to provide you with the care you need, when you need it.  We recommend signing up for the patient portal called "MyChart".  Sign up information is provided on this After Visit Summary.  MyChart is used to connect with patients for Virtual Visits (Telemedicine).  Patients are able to view lab/test results, encounter notes, upcoming appointments, etc.  Non-urgent messages can be sent to your provider as well.   To learn more about what you can do with MyChart, go to [email or web address].    Your next appointment:   1 year(s)  Provider:   Philip Nahser, MD      

## 2023-01-20 NOTE — Progress Notes (Signed)
Cardiology Office Note   Date:  01/20/2023   ID:  Jessica Bradford, Jessica Bradford [birth year], MRN [id number]  PCP:  April Manson, NP  Cardiologist:   Kristeen Miss, MD   1. Hyperliidemia 2. Dizziness    Previous notes:  Brender is a 57 year old female with a history of hypercholesterolemia. She has a very strong family history of coronary artery disease. She's done very well since she last saw Dr. Deborah Chalk last year. She walks 4-6 miles every day.  She denies any chest pain or shortness of breath with her walking.  She has a history of mitral valve prolapse. She has occasional palpitations.  These  typically occur when she is at rest.      She is recovering from an episode of cellulitis. She apparently stepped on a splinter on her deck and developed cellulitis in her foot.   She was on antibiotics for several weeks. She is still not exercising because of some persistent foot pain and swelling.  Nov. 19, 2014:  No CP, no dyspnea,  Still walking 5+ miles a day.  Also doing strength training.    She gets a discount from her insurance plan to work out    Dec 23, 2014:  Jessica Bradford is a 57 y.o. female who presents for  Follow up of her hyperlipidemia Walks regularly,  Also does strength training.  Trying to watch her diet.   Jan 07, 2016: Doing well  Has had several episodes of waking up "thinking she was not breathing " Snores some but does not have apneic episodes according to husband  Exercising some .   [place] twice a week , walks the other days  Has had difficulty losing weight .   January 26, 2017:  Jessica Bradford is doing well from a cardiac standpoint Is very fatigued for the past 6 months . Is tired all of the time  Takes naps frequently  Still working out at [employer] - works outs are going ok .   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    Is losing some hair ( which is some concern)    March 02, 2019 :  Jessica Bradford is see today for episodes of dizziness.  Hx of hyperlipidemia  3 times over the past 7 weeks. Near syncope.   Feels fine and then has palpitations,  Legs feel like jello and has near syncope  One episode happened at [facility],  She bought a BP cuff,  BP was 95/64 when she got home Has not occurred since June 27.  Drinks water  All have occurred as she is standing up  From a seated position .  2 episodes at lunch   Breakfast - oatmeal  Or toast, yogart Lunch - salad or sandwich Dinner - frequently does not have meat .    Echo in 2012 was normal.  Has worn a monitor about 10 years ago    Oct. 4, 2021: Jessica Bradford is seen today for follow up of her hyperlipidemia and dizziness Has had near syncope  Had episodes of "legs feel like jello"  Event monitor - revealed sinus rhythm including sinus tach and sinus brady.   No serious arrhythmias seen Echo - July 2020 revealed trace MR, trae TR, normal LV function Has had 2 episodes of near syncope.    Has been working out with a personal  trainer who noted that her HR was slow Her HR during exercise goes up to the 90s  Oct. 30, 2023 Jessica Bradford is seen for follow up of her HLD and dizziness. Hx of MVP / MR   Has been under lots of stress at work  [employer] like she cannot take a deep breath  Has been limited but a knee injury   No limitations with breathing when she is walking .   No PND or orthopnea   Works at [employer]  ( [employer] and [employer] )  [employer] is 196 lbs  Recent labs from her primary medical doctor reveal a total cholesterol of 138 Triglyceride is 75 HDL is 74 LDL is 49.  These levels look great    Jan 20, 2023   Still having some back pain , No CP or dyspnea   Coronary CTA shows  CAC is 10.7 CTA shows minimal plaque in the ostial LAD  LDL is 49 ( at goal )  HDL is 74 Trigs = 75      Past Medical History:  Diagnosis Date   Anemia    Anxiety    Asthma 2020   Heart palpitations    History of  chicken pox    Hyperlipidemia    Incomplete right bundle branch block    Mitral valve prolapse    Urinary incontinence     Past Surgical History:  Procedure Laterality Date   CHOLECYSTECTOMY     TUBAL LIGATION  1996   TUBAL LIGATION       Current Outpatient Medications  Medication Sig Dispense Refill   albuterol (PROAIR HFA) 108 (90 Base) MCG/ACT inhaler TAKE 2 PUFFS BY MOUTH EVERY 6 HOURS AS NEEDED FOR WHEEZE OR SHORTNESS OF BREATH 8.5 g 2   ALPRAZolam (XANAX) 0.25 MG tablet 0.5-1 Tablet(s) By Mouth PRN     aspirin 81 MG chewable tablet Chew by mouth.     atorvastatin (LIPITOR) 80 MG tablet TAKE 1 TABLET BY MOUTH EVERY DAY 90 tablet 2   Budesonide (PULMICORT FLEXHALER) 90 MCG/ACT inhaler Inhale 1-2 puffs into the lungs 2 (two) times daily. Rinse mouth after each use. 1 each 5   Carbinoxamine Maleate 4 MG TABS Take 1 tablet by mouth 3 (three) times daily as needed.     montelukast (SINGULAIR) 10 MG tablet Take 1 tablet (10 mg total) by mouth at bedtime. 90 tablet 2   multivitamin (THERAGRAN) per tablet Take 1 tablet by mouth daily.     nitroGLYCERIN (NITROSTAT) 0.4 MG SL tablet Place 1 tablet (0.4 mg total) under the tongue every 5 (five) minutes as needed for chest pain. 30 tablet 3   oxybutynin (DITROPAN XL) 15 MG 24 hr tablet Take 15 mg by mouth every morning.     sertraline (ZOLOFT) 50 MG tablet Take 1 tablet by mouth daily.  2   topiramate (TOPAMAX) 25 MG tablet Take by mouth.  Take one tablet (25 mg dose) by mouth at bedtime     WEGOVY 2.4 MG/0.75ML SOAJ Inject 2.4 mg into the skin once a week.     No current facility-administered medications for this visit.    Allergies:   Sulfa antibiotics, Doxycycline, and Elemental sulfur    Social History:  The patient  reports that she has never smoked. She has never been exposed to tobacco smoke. She has never used smokeless tobacco. She reports current alcohol use. She reports that she does not use drugs.   Family History:  The  patient's family history includes Alcohol abuse in her brother, brother, father, and sister; Cancer (age of onset: 77) in her paternal grandfather; Dementia in her paternal grandmother; Drug abuse in her brother and brother; Heart attack in her brother and mother; Heart disease in her maternal grandfather and maternal grandmother; Hypertension in her father and paternal grandmother; Other in her paternal grandfather; Stroke in her father.    ROS:  Please see the history of present illness.    Physical Exam: Blood pressure 102/66, pulse 65, height 5' 5.5" (1.664 m), weight 187 lb 3.2 oz (84.9 kg), last menstrual period 09/23/2017, SpO2 99 %.       GEN:  Well nourished, well developed in no acute distress HEENT: Normal NECK: No JVD; No carotid bruits LYMPHATICS: No lymphadenopathy CARDIAC: RRR,  soft systolic murmur  RESPIRATORY:  Clear to auscultation without rales, wheezing or rhonchi  ABDOMEN: Soft, non-tender, non-distended MUSCULOSKELETAL:  No edema; No deformity  SKIN: Warm and dry NEUROLOGIC:  Alert and oriented x 3     EKG:        Recent Labs: No results found for requested labs within last 365 days.    Lipid Panel    Component Value Date/Time   CHOL 138 05/22/2021 1000   CHOL 136 07/11/2013 0925   TRIG 60 05/22/2021 1000   TRIG 96 07/11/2013 0925   HDL 60 05/22/2021 1000   HDL 57 07/11/2013 0925   CHOLHDL 2.3 05/22/2021 1000   CHOLHDL 2 11/20/2019 1530   VLDL 13.4 11/20/2019 1530   LDLCALC 65 05/22/2021 1000   LDLCALC 60 07/11/2013 0925      Wt Readings from Last 3 Encounters:  01/20/23 187 lb 3.2 oz (84.9 kg)  09/23/22 196 lb (88.9 kg)  06/21/22 196 lb (88.9 kg)      Other studies Reviewed: Additional studies/ records that were reviewed today include: . Review of the above records demonstrates:   ASSESSMENT AND PLAN:    2.  Hyperliidemia-    LDL is 49 .  Her CAC score is 10.7.   She is at her LDL goal. Continue current medications   3.  Sinus  bradycardia:        4.  Systolic murmur:   has mild MR   , unchanged from previous exams   Current medicines are reviewed at length with the patient today.  The patient does not have concerns regarding medicines.  The following changes have been made:  no change  Labs/ tests ordered today include:   No orders of the defined types were placed in this encounter.     Disposition:   FU with me in 1 year     Kristeen Miss, MD  01/20/2023 3:04 PM    [facility] [address] [place], [place], [place]  [address] Phone: [phone]; Fax: [phone]

## 2023-03-12 ENCOUNTER — Other Ambulatory Visit: Payer: Self-pay | Admitting: Cardiovascular Disease

## 2023-03-23 NOTE — Progress Notes (Unsigned)
Follow Up Note  RE: Jessica Bradford MRN: [id number] DOB: 05-13-1966 Date of Office Visit: 03/24/2023  Referring provider: April Manson, NP Primary care provider: April Manson, NP  Chief Complaint: No chief complaint on file.  History of Present Illness: I had the pleasure of seeing Jessica Bradford for a follow up visit at the [facility] on 03/23/2023. She is a 57 y.o. female, who is being followed for asthma, allergic rhinitis. Her previous allergy office visit was on 09/23/2022 with Dr. Selena Batten. Today is a regular follow up visit.  Moderate persistent asthma Doing well with Flovent 2 puffs Bid and Singulair daily. Used to be on Symbicort in the past. 1 prednisone course last year.  Today's spirometry was normal. Daily controller medication(s): continue Flovent (generic) 2 puffs twice a day with spacer and rinse mouth afterwards. Let me know if it's not covered.  If you notice that you need to get prednisone when your asthma flares then we may add on a stronger inhaler when you are having a respiratory infection or asthma flares.  May use albuterol rescue inhaler 2 puffs every 4 to 6 hours as needed for shortness of breath, chest tightness, coughing, and wheezing.  Monitor frequency of use.  Get spirometry at next visit.   Perennial allergic rhinitis Past history - 2020 skin testing was positive to mold, cat and dog. Steroid nasal sprays cause epistaxis. Interim history - controlled with Singulair daily. See below for environmental control measures. Continue Singulair (montelukast) 10mg  daily at night. Use over the counter antihistamines such as Zyrtec (cetirizine), Claritin (loratadine), Allegra (fexofenadine), or Xyzal (levocetirizine) daily as needed. May take twice a day during allergy flares. May switch antihistamines every few months. Use olopatadine eye drops 0.2% once a day as needed for itchy/watery eyes. May use saline nasal spray as  needed.   Return in about 6 months (around 03/24/2023).  Assessment and Plan: Jessica Bradford is a 57 y.o. female with: No problem-specific Assessment & Plan notes found for this encounter.  No follow-ups on file.  No orders of the defined types were placed in this encounter.  Lab Orders  No laboratory test(s) ordered today    Diagnostics: Spirometry:  Tracings reviewed. Her effort: {Blank single:19197::"Good reproducible efforts.","It was hard to get consistent efforts and there is a question as to whether this reflects a maximal maneuver.","Poor effort, data can not be interpreted."} FVC: ***L FEV1: ***L, ***% predicted FEV1/FVC ratio: ***% Interpretation: {Blank single:19197::"Spirometry consistent with mild obstructive disease","Spirometry consistent with moderate obstructive disease","Spirometry consistent with severe obstructive disease","Spirometry consistent with possible restrictive disease","Spirometry consistent with mixed obstructive and restrictive disease","Spirometry uninterpretable due to technique","Spirometry consistent with normal pattern","No overt abnormalities noted given today's efforts"}.  Please see scanned spirometry results for details.  Skin Testing: {Blank single:19197::"Select foods","Environmental allergy panel","Environmental allergy panel and select foods","Food allergy panel","None","Deferred due to recent antihistamines use"}. *** Results discussed with patient/family.   Medication List:  Current Outpatient Medications  Medication Sig Dispense Refill  . albuterol (PROAIR HFA) 108 (90 Base) MCG/ACT inhaler TAKE 2 PUFFS BY MOUTH EVERY 6 HOURS AS NEEDED FOR WHEEZE OR SHORTNESS OF BREATH 8.5 g 2  . ALPRAZolam (XANAX) 0.25 MG tablet 0.5-1 Tablet(s) By Mouth PRN    . aspirin 81 MG chewable tablet Chew by mouth.    Marland Kitchen atorvastatin (LIPITOR) 80 MG tablet TAKE 1 TABLET BY MOUTH EVERY DAY 90 tablet 2  . Budesonide (PULMICORT FLEXHALER) 90 MCG/ACT inhaler Inhale 1-2  puffs  into the lungs 2 (two) times daily. Rinse mouth after each use. 1 each 5  . Carbinoxamine Maleate 4 MG TABS Take 1 tablet by mouth 3 (three) times daily as needed.    . montelukast (SINGULAIR) 10 MG tablet Take 1 tablet (10 mg total) by mouth at bedtime. 90 tablet 2  . multivitamin (THERAGRAN) per tablet Take 1 tablet by mouth daily.    . nitroGLYCERIN (NITROSTAT) 0.4 MG SL tablet Place 1 tablet (0.4 mg total) under the tongue every 5 (five) minutes as needed for chest pain. 30 tablet 3  . oxybutynin (DITROPAN XL) 15 MG 24 hr tablet Take 15 mg by mouth every morning.    . sertraline (ZOLOFT) 50 MG tablet Take 1 tablet by mouth daily.  2  . topiramate (TOPAMAX) 25 MG tablet Take by mouth.  Take one tablet (25 mg dose) by mouth at bedtime     No current facility-administered medications for this visit.   Allergies: Allergies  Allergen Reactions  . Sulfa Antibiotics Rash  . Doxycycline Rash and Other (See Comments)    Other reaction(s): vomiting  . Elemental Sulfur Rash   I reviewed her past medical history, social history, family history, and environmental history and no significant changes have been reported from her previous visit.  Review of Systems  Constitutional:  Negative for appetite change, chills, fever and unexpected weight change.  HENT:  Positive for postnasal drip. Negative for congestion and rhinorrhea.   Eyes:  Negative for itching.  Respiratory:  Positive for cough. Negative for chest tightness, shortness of breath and wheezing.   Gastrointestinal:  Negative for abdominal pain.  Skin:  Negative for rash.  Allergic/Immunologic: Positive for environmental allergies.  Neurological:  Negative for headaches.   Objective: LMP 09/23/2017 (Exact Date)  There is no height or weight on file to calculate BMI. Physical Exam Vitals and nursing note reviewed.  Constitutional:      Appearance: Normal appearance. She is well-developed.  HENT:     Head: Normocephalic and  atraumatic.     Right Ear: Tympanic membrane and external ear normal.     Left Ear: Tympanic membrane and external ear normal.     Nose: Nose normal.     Mouth/Throat:     Mouth: Mucous membranes are moist.     Pharynx: Oropharynx is clear.  Eyes:     Conjunctiva/sclera: Conjunctivae normal.  Cardiovascular:     Rate and Rhythm: Normal rate and regular rhythm.     Heart sounds: Normal heart sounds. No murmur heard. Pulmonary:     Effort: Pulmonary effort is normal.     Breath sounds: Normal breath sounds. No wheezing, rhonchi or rales.  Musculoskeletal:     Cervical back: Neck supple.  Skin:    General: Skin is warm.     Findings: No rash.  Neurological:     Mental Status: She is alert and oriented to person, place, and time.  Psychiatric:        Behavior: Behavior normal.  Previous notes and tests were reviewed. The plan was reviewed with the patient/family, and all questions/concerned were addressed.  It was my pleasure to see Jessica Bradford today and participate in her care. Please feel free to contact me with any questions or concerns.  Sincerely,  Wyline Mood, DO Allergy & Immunology  [facility] office: [phone] [facility] office: [phone]

## 2023-03-24 ENCOUNTER — Other Ambulatory Visit: Payer: Self-pay

## 2023-03-24 ENCOUNTER — Encounter: Payer: Self-pay | Admitting: Allergy

## 2023-03-24 ENCOUNTER — Ambulatory Visit (INDEPENDENT_AMBULATORY_CARE_PROVIDER_SITE_OTHER): Payer: No Typology Code available for payment source | Admitting: Allergy

## 2023-03-24 VITALS — BP 108/68 | HR 75 | Temp 98.1°F | Resp 18 | Wt 185.5 lb

## 2023-03-24 DIAGNOSIS — J3081 Allergic rhinitis due to animal (cat) (dog) hair and dander: Secondary | ICD-10-CM | POA: Diagnosis not present

## 2023-03-24 DIAGNOSIS — J3089 Other allergic rhinitis: Secondary | ICD-10-CM | POA: Diagnosis not present

## 2023-03-24 DIAGNOSIS — J4540 Moderate persistent asthma, uncomplicated: Secondary | ICD-10-CM | POA: Diagnosis not present

## 2023-03-24 NOTE — Patient Instructions (Addendum)
Asthma  Normal breathing test today. Daily controller medication(s): continue Pulmicort 1 puff twice a day with spacer and rinse mouth afterwards. If you notice that you need to get prednisone when your asthma flares then we may add on a stronger inhaler when you are having a respiratory infection or asthma flares.  May use albuterol rescue inhaler 2 puffs every 4 to 6 hours as needed for shortness of breath, chest tightness, coughing, and wheezing.  Monitor frequency of use - if you need to use it more than twice per week on a consistent basis let us know.  Breathing control goals:  Full participation in all desired activities (may need albuterol before activity) Albuterol use two times or less a week on average (not counting use with activity) Cough interfering with sleep two times or less a month Oral steroids no more than once a year No hospitalizations   Perennial allergic rhinitis 2020 skin testing was positive to mold, cat and dog. See below for environmental control measures. Continue Singulair (montelukast) 10mg  daily at night. Use over the counter antihistamines such as Zyrtec (cetirizine), Claritin (loratadine), Allegra (fexofenadine), or Xyzal (levocetirizine) daily as needed. May take twice a day during allergy flares. May switch antihistamines every few months. Use olopatadine eye drops 0.2% once a day as needed for itchy/watery eyes. May use saline nasal spray as needed.  Follow up in 5 months or sooner if needed.   Pet Allergen Avoidance: Contrary to popular opinion, there are no "hypoallergenic" breeds of dogs or cats. That is because people are not allergic to an animal's hair, but to an allergen found in the animal's saliva, dander (dead skin flakes) or urine. Pet allergy symptoms typically occur within minutes. For some people, symptoms can build up and become most severe 8 to 12 hours after contact with the animal. People with severe allergies can experience  reactions in public places if dander has been transported on the pet owners' clothing. Keeping an animal outdoors is only a partial solution, since homes with pets in the yard still have higher concentrations of animal allergens. Before getting a pet, ask your allergist to determine if you are allergic to animals. If your pet is already considered part of your family, try to minimize contact and keep the pet out of the bedroom and other rooms where you spend a great deal of time. As with dust mites, vacuum carpets often or replace carpet with a hardwood floor, tile or linoleum. High-efficiency particulate air (HEPA) cleaners can reduce allergen levels over time. While dander and saliva are the source of cat and dog allergens, urine is the source of allergens from rabbits, hamsters, mice and [place] pigs; so ask a non-allergic family member to clean the animal's cage. If you have a pet allergy, talk to your allergist about the potential for allergy immunotherapy (allergy shots). This strategy can often provide long-term relief. Mold Control Mold and fungi can grow on a variety of surfaces provided certain temperature and moisture conditions exist.  Outdoor molds grow on plants, decaying vegetation and soil. The major outdoor mold, Alternaria and Cladosporium, are found in very high numbers during hot and dry conditions. Generally, a late summer - fall peak is seen for common outdoor fungal spores. Rain will temporarily lower outdoor mold spore count, but counts rise rapidly when the rainy period ends. The most important indoor molds are Aspergillus and Penicillium. Dark, humid and poorly ventilated basements are ideal sites for mold growth. The next most common sites  of mold growth are the bathroom and the kitchen. Outdoor (Seasonal) Mold Control Use air conditioning and keep windows closed. Avoid exposure to decaying vegetation. Avoid leaf raking. Avoid grain handling. Consider wearing a face mask if  working in moldy areas.  Indoor (Perennial) Mold Control  Maintain humidity below 50%. Get rid of mold growth on hard surfaces with water, detergent and, if necessary, 5% bleach (do not mix with other cleaners). Then dry the area completely. If mold covers an area more than 10 square feet, consider hiring an indoor environmental professional. For clothing, washing with soap and water is best. If moldy items cannot be cleaned and dried, throw them away. Remove sources e.g. contaminated carpets. Repair and seal leaking roofs or pipes. Using dehumidifiers in damp basements may be helpful, but empty the water and clean units regularly to prevent mildew from forming. All rooms, especially basements, bathrooms and kitchens, require ventilation and cleaning to deter mold and mildew growth. Avoid carpeting on concrete or damp floors, and storing items in damp areas.

## 2023-04-17 ENCOUNTER — Other Ambulatory Visit: Payer: Self-pay | Admitting: Allergy

## 2023-04-28 ENCOUNTER — Encounter: Payer: Self-pay | Admitting: Plastic Surgery

## 2023-04-29 ENCOUNTER — Other Ambulatory Visit: Payer: Self-pay | Admitting: Medical Genetics

## 2023-04-29 DIAGNOSIS — Z006 Encounter for examination for normal comparison and control in clinical research program: Secondary | ICD-10-CM

## 2023-05-09 ENCOUNTER — Encounter: Payer: No Typology Code available for payment source | Admitting: Plastic Surgery

## 2023-08-22 ENCOUNTER — Other Ambulatory Visit: Payer: Self-pay | Admitting: Allergy

## 2023-08-25 ENCOUNTER — Ambulatory Visit: Payer: No Typology Code available for payment source | Admitting: Allergy

## 2023-08-25 ENCOUNTER — Encounter: Payer: Self-pay | Admitting: Allergy

## 2023-08-25 VITALS — BP 110/70 | HR 76 | Temp 98.1°F | Resp 16 | Ht 65.5 in | Wt 185.0 lb

## 2023-08-25 DIAGNOSIS — J3089 Other allergic rhinitis: Secondary | ICD-10-CM

## 2023-08-25 DIAGNOSIS — J4530 Mild persistent asthma, uncomplicated: Secondary | ICD-10-CM | POA: Diagnosis not present

## 2023-08-25 DIAGNOSIS — J3081 Allergic rhinitis due to animal (cat) (dog) hair and dander: Secondary | ICD-10-CM

## 2023-08-25 NOTE — Progress Notes (Signed)
 Follow Up Note  RE: Jessica Bradford MRN: [id number] DOB: [birth year] Date of Office Visit: 08/25/2023  Referring provider: Teresa Aldona LITTIE, NP Primary care provider: Teresa Aldona LITTIE, NP  Chief Complaint: Asthma  History of Present Illness: I had the pleasure of seeing Jessica Bradford for a follow up visit at the [facility] on 08/25/2023. She is a 58 y.o. female, who is being followed for asthma and allergic rhinitis. Her previous allergy  office visit was on 03/24/2023 with Dr. Luke. Today is a regular follow up visit.  Discussed the use of AI scribe software for clinical note transcription with the patient, who gave verbal consent to proceed.  The patient presented for a follow-up visit. She reported no recent issues with her breathing and has been adherent to her prescribed Pulmicort  regimen, taking one puff twice a day without any missed doses. She has not required the use of her rescue inhaler and has not had any recent visits to the ER or urgent care. She has not experienced any recent illnesses that have exacerbated her respiratory symptoms.  The patient also reported seasonal allergies, which have been managed with Montelukast  and Xyzal. She has not had any recent sinus infections. She does not use nasal sprays or eye drops for her allergies.  The patient has not had any changes in her medical history, including new medications, surgeries, or diagnoses. She has been on vacation and is due to return to work. She has been spending time with her grandchildren during her time off.     Assessment and Plan: Jessica Bradford is a 58 y.o. female with: Mild persistent asthma without complication Past history - Used to be on Symbicort  in the past. 1 prednisone  course last year. Flares in the fall/winter.  Interim history - no flares.  Normal spirometry today.  Daily controller medication(s): continue Pulmicort  90mcg 1 puff twice a day with spacer and rinse mouth afterwards. Starting  in March - decrease Pulmicort  to 1 puff once a day. If you notice issues then go back to 1 puff twice a day as before.  During respiratory infections/flares:  Increase Pulmicort  to 2 puffs twice a day for 1-2 weeks until your breathing symptoms return to baseline.  Pretreat with albuterol  2 puffs.  May use albuterol  rescue inhaler 2 puffs every 4 to 6 hours as needed for shortness of breath, chest tightness, coughing, and wheezing.  Monitor frequency of use - if you need to use it more than twice per week on a consistent basis let us  know.    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 Steroid nasal sprays cause epistaxis. Interim history - Well controlled with Montelukast  and Xyzal. No recent sinus infections. Continue environmental control measures. Continue Singulair  (montelukast ) 10mg  daily at night. Use over the counter antihistamines such as Zyrtec (cetirizine), Claritin (loratadine), Allegra (fexofenadine), or Xyzal (levocetirizine) daily as needed. May take twice a day during allergy  flares. May switch antihistamines every few months. Use olopatadine  eye drops 0.2% once a day as needed for itchy/watery eyes. May use saline nasal spray as needed.  Return in about 5 months (around 01/23/2024).  No orders of the defined types were placed in this encounter.  Lab Orders  No laboratory test(s) ordered today    Diagnostics: Spirometry:  Tracings reviewed. Her effort: Good reproducible efforts. FVC: 4.18L FEV1: 3.31L, 119% predicted FEV1/FVC ratio: 79% Interpretation: Spirometry  consistent with normal pattern.  Please see scanned spirometry results for details. Results discussed with patient/family.   Medication List:  Current Outpatient Medications  Medication Sig Dispense Refill   albuterol  (PROAIR  HFA) 108 (90 Base) MCG/ACT inhaler TAKE 2 PUFFS BY MOUTH EVERY 6 HOURS AS NEEDED FOR  WHEEZE OR SHORTNESS OF BREATH 8.5 g 2   ALPRAZolam (XANAX) 0.25 MG tablet 0.5-1 Tablet(s) By Mouth PRN     aspirin 81 MG chewable tablet Chew by mouth.     atorvastatin  (LIPITOR) 80 MG tablet TAKE 1 TABLET BY MOUTH EVERY DAY 90 tablet 2   Carbinoxamine  Maleate 4 MG TABS Take 1 tablet by mouth 3 (three) times daily as needed.     montelukast  (SINGULAIR ) 10 MG tablet TAKE 1 TABLET BY MOUTH EVERYDAY AT BEDTIME 90 tablet 0   multivitamin (THERAGRAN) per tablet Take 1 tablet by mouth daily.     oxybutynin (DITROPAN XL) 15 MG 24 hr tablet Take 15 mg by mouth every morning.     PULMICORT  FLEXHALER 90 MCG/ACT inhaler INHALE 1-2 PUFFS INTO THE LUNGS 2 (TWO) TIMES DAILY. RINSE MOUTH AFTER EACH USE. 1 each 5   sertraline (ZOLOFT) 50 MG tablet Take 1 tablet by mouth daily.  2   topiramate (TOPAMAX) 25 MG tablet Take by mouth.  Take one tablet (25 mg dose) by mouth at bedtime     VAGIFEM 10 MCG TABS vaginal tablet Place 1 tablet vaginally 2 (two) times a week.     WEGOVY 2.4 MG/0.75ML SOAJ Inject into the skin.     nitroGLYCERIN  (NITROSTAT ) 0.4 MG SL tablet Place 1 tablet (0.4 mg total) under the tongue every 5 (five) minutes as needed for chest pain. 30 tablet 3   No current facility-administered medications for this visit.   Allergies: Allergies  Allergen Reactions   Sulfa Antibiotics Rash   Doxycycline Rash and Other (See Comments)    Other reaction(s): vomiting   Elemental Sulfur Rash   I reviewed her past medical history, social history, family history, and environmental history and no significant changes have been reported from her previous visit.  Review of Systems  Constitutional:  Negative for appetite change, chills, fever and unexpected weight change.  HENT:  Negative for congestion, postnasal drip and rhinorrhea.   Eyes:  Negative for itching.  Respiratory:  Negative for cough, chest tightness, shortness of breath and wheezing.   Gastrointestinal:  Negative for abdominal pain.  Skin:   Negative for rash.  Allergic/Immunologic: Positive for environmental allergies.  Neurological:  Negative for headaches.    Objective: BP 110/70   Pulse 76   Temp 98.1 F (36.7 C) (Temporal)   Resp 16   Ht 5' 5.5 (1.664 m)   Wt 185 lb (83.9 kg)   LMP 09/23/2017 (Exact Date)   SpO2 98%   BMI 30.32 kg/m  Body mass index is 30.32 kg/m. Physical Exam Vitals and nursing note reviewed.  Constitutional:      Appearance: Normal appearance. She is well-developed.  HENT:     Head: Normocephalic and atraumatic.     Right Ear: Tympanic membrane and external ear normal.     Left Ear: Tympanic membrane and external ear normal.     Nose: Nose normal.     Mouth/Throat:     Mouth: Mucous membranes are moist.     Pharynx: Oropharynx is clear.  Eyes:     Conjunctiva/sclera: Conjunctivae normal.  Cardiovascular:     Rate and Rhythm: Normal rate and regular rhythm.  Heart sounds: Normal heart sounds. No murmur heard. Pulmonary:     Effort: Pulmonary effort is normal.     Breath sounds: Normal breath sounds. No wheezing, rhonchi or rales.  Musculoskeletal:     Cervical back: Neck supple.  Skin:    General: Skin is warm.     Findings: No rash.  Neurological:     Mental Status: She is alert and oriented to person, place, and time.  Psychiatric:        Behavior: Behavior normal.    Previous notes and tests were reviewed. The plan was reviewed with the patient/family, and all questions/concerned were addressed.  It was my pleasure to see Jessica Bradford today and participate in her care. Please feel free to contact me with any questions or concerns.  Sincerely,  Orlan Cramp, DO Allergy  & Immunology  [facility]  [place] office: [phone] [facility] office: [phone]

## 2023-08-25 NOTE — Patient Instructions (Addendum)
 Asthma  Normal breathing test today. Daily controller medication(s): continue Pulmicort  90mcg 1 puff twice a day with spacer and rinse mouth afterwards. Starting in March - decrease Pulmicort  to 1 puff once a day. If you notice issues then go back to 1 puff twice a day as before.  During respiratory infections/flares:  Increase Pulmicort  to 2 puffs twice a day for 1-2 weeks until your breathing symptoms return to baseline.  Pretreat with albuterol  2 puffs.  May use albuterol  rescue inhaler 2 puffs every 4 to 6 hours as needed for shortness of breath, chest tightness, coughing, and wheezing.  Monitor frequency of use - if you need to use it more than twice per week on a consistent basis let us  know.  Breathing control goals:  Full participation in all desired activities (may need albuterol  before activity) Albuterol  use two times or less a week on average (not counting use with activity) Cough interfering with sleep two times or less a month Oral steroids no more than once a year No hospitalizations   Allergic rhinitis 2020 skin testing positive to mold, cat and dog. Continue environmental control measures. Continue Singulair  (montelukast ) 10mg  daily at night. Use over the counter antihistamines such as Zyrtec (cetirizine), Claritin (loratadine), Allegra (fexofenadine), or Xyzal (levocetirizine) daily as needed. May take twice a day during allergy  flares. May switch antihistamines every few months. Use olopatadine  eye drops 0.2% once a day as needed for itchy/watery eyes. May use saline nasal spray as needed.  Follow up in 5 months or sooner if needed.

## 2023-10-15 ENCOUNTER — Other Ambulatory Visit: Payer: Self-pay | Admitting: Allergy

## 2023-12-06 ENCOUNTER — Other Ambulatory Visit: Payer: Self-pay | Admitting: Cardiovascular Disease

## 2024-01-22 NOTE — Progress Notes (Unsigned)
 Cardiology Office Note   Date:  01/23/2024   ID:  [name], DOB [birth year], MRN [id number]  PCP:  Sallye Crease, NP  Cardiologist:   Ahmad Alert, MD   1. Hyperliidemia 2. Dizziness    Previous notes:  Jessica Bradford is a 58 year old female with a history of hypercholesterolemia. She has a very strong family history of coronary artery disease. She's done very well since she last saw Dr. Ollis Bi last year. She walks 4-6 miles every day.  She denies any chest pain or shortness of breath with her walking.  She has a history of mitral valve prolapse. She has occasional palpitations.  These  typically occur when she is at rest.      She is recovering from an episode of cellulitis. She apparently stepped on a splinter on her deck and developed cellulitis in her foot.   She was on antibiotics for several weeks. She is still not exercising because of some persistent foot pain and swelling.  Nov. 19, 2014:  No CP, no dyspnea,  Still walking 5+ miles a day.  Also doing strength training.    She gets a discount from her insurance plan to work out    Dec 23, 2014:  Jessica Bradford is a 58 y.o. female who presents for  Follow up of her hyperlipidemia Walks regularly,  Also does strength training.  Trying to watch her diet.   Jan 07, 2016: Doing well  Has had several episodes of waking up "thinking she was not breathing " Snores some but does not have apneic episodes according to husband  Exercising some .   [place] twice a week , walks the other days  Has had difficulty losing weight .   January 26, 2017:  Jessica Bradford is doing well from a cardiac standpoint Is very fatigued for the past 6 months . Is tired all of the time  Takes naps frequently  Still working out at [employer] - works outs are going ok .   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    Is losing some hair ( which is some concern)    March 02, 2019 :  Jessica Bradford is see today for episodes of dizziness.  Hx of hyperlipidemia  3 times over the past 7 weeks. Near syncope.   Feels fine and then has palpitations,  Legs feel like jello and has near syncope  One episode happened at [facility],  She bought a BP cuff,  BP was 95/64 when she got home Has not occurred since June 27.  Drinks water  All have occurred as she is standing up  From a seated position .  2 episodes at lunch   Breakfast - oatmeal  Or toast, yogart Lunch - salad or sandwich Dinner - frequently does not have meat .    Echo in 2012 was normal.  Has worn a monitor about 10 years ago    Oct. 4, 2021: Jessica Bradford is seen today for follow up of her hyperlipidemia and dizziness Has had near syncope  Had episodes of "legs feel like jello"  Event monitor - revealed sinus rhythm including sinus tach and sinus brady.   No serious arrhythmias seen Echo - July 2020 revealed trace MR, trae TR, normal LV function Has had 2 episodes of near syncope.    Has been working out with a personal  trainer who noted that her HR was slow Her HR during exercise goes up to the 90s  Oct. 30, 2023 Jessica Bradford is seen for follow up of her HLD and dizziness. Hx of MVP / MR   Has been under lots of stress at work  [employer] like she cannot take a deep breath  Has been limited but a knee injury   No limitations with breathing when she is walking .   No PND or orthopnea   Works at [employer]  ( [employer] and [employer] )  [employer] is 196 lbs  Recent labs from her primary medical doctor reveal a total cholesterol of 138 Triglyceride is 75 HDL is 74 LDL is 49.  These levels look great    Jan 20, 2023   Still having some back pain , No CP or dyspnea   Coronary CTA shows  CAC is 10.7 CTA shows minimal plaque in the ostial LAD  LDL is 49 ( at goal )  HDL is 74 Trigs = 75   January 23, 2024 Jessica Bradford is seen for follow up of her heart murmur, CAC, HLD  Has minimal LAD plaque Her LDL is 66 No cp, no  dyspnea   Is getting back into exercising . Has been busy taking care of a sick friend ( with pancreatic cancer)     Past Medical History:  Diagnosis Date   Anemia    Anxiety    Asthma 2020   Heart palpitations    History of chicken pox    Hyperlipidemia    Incomplete right bundle branch block    Mitral valve prolapse    Urinary incontinence     Past Surgical History:  Procedure Laterality Date   CHOLECYSTECTOMY     TUBAL LIGATION  1996   TUBAL LIGATION       Current Outpatient Medications  Medication Sig Dispense Refill   albuterol  (PROAIR  HFA) 108 (90 Base) MCG/ACT inhaler TAKE 2 PUFFS BY MOUTH EVERY 6 HOURS AS NEEDED FOR WHEEZE OR SHORTNESS OF BREATH 8.5 g 2   ALPRAZolam (XANAX) 0.25 MG tablet 0.5-1 Tablet(s) By Mouth PRN     aspirin 81 MG chewable tablet Chew by mouth.     atorvastatin  (LIPITOR) 80 MG tablet TAKE 1 TABLET BY MOUTH EVERY DAY 90 tablet 0   Budesonide  (PULMICORT  FLEXHALER) 90 MCG/ACT inhaler Inhale 2 puffs into the lungs 2 (two) times daily. Rinse mouth after each use. 1 each 4   meloxicam (MOBIC) 15 MG tablet Take 15 mg by mouth daily.     montelukast  (SINGULAIR ) 10 MG tablet TAKE 1 TABLET BY MOUTH EVERYDAY AT BEDTIME 90 tablet 0   multivitamin (THERAGRAN) per tablet Take 1 tablet by mouth daily.     oxybutynin (DITROPAN XL) 15 MG 24 hr tablet Take 15 mg by mouth every morning.     sertraline (ZOLOFT) 50 MG tablet Take 1 tablet by mouth daily.  2   topiramate (TOPAMAX) 25 MG tablet Take by mouth.  Take one tablet (25 mg dose) by mouth at bedtime     WEGOVY 2.4 MG/0.75ML SOAJ Inject into the skin.     nitroGLYCERIN  (NITROSTAT ) 0.4 MG SL tablet Place 1 tablet (0.4 mg total) under the tongue every 5 (five) minutes as needed for chest pain. 30 tablet 3   No current facility-administered medications for this visit.    Allergies:   Sulfa antibiotics, Doxycycline, and Elemental sulfur    Social History:  The patient  reports that  she has never smoked. She  has never been exposed to tobacco smoke. She has never used smokeless tobacco. She reports current alcohol use. She reports that she does not use drugs.   Family History:  The patient's family history includes Alcohol abuse in her brother, brother, father, and sister; Cancer (age of onset: 84) in her paternal grandfather; Dementia in her paternal grandmother; Drug abuse in her brother and brother; Heart attack in her brother and mother; Heart disease in her maternal grandfather and maternal grandmother; Hypertension in her father and paternal grandmother; Other in her paternal grandfather; Stroke in her father.    ROS:  Please see the history of present illness.    Physical Exam: Blood pressure 104/72, pulse 88, height 5' 5.5" (1.664 m), weight 192 lb 3.2 oz (87.2 kg), last menstrual period 09/23/2017, SpO2 96%.       GEN:  Well nourished, well developed in no acute distress HEENT: Normal NECK: No JVD; No carotid bruits LYMPHATICS: No lymphadenopathy CARDIAC: RRR  , soft systolic murmur below left breast  RESPIRATORY:  Clear to auscultation without rales, wheezing or rhonchi  ABDOMEN: Soft, non-tender, non-distended MUSCULOSKELETAL:  No edema; No deformity  SKIN: Warm and dry NEUROLOGIC:  Alert and oriented x 3    EKG Interpretation Date/Time:  Monday January 23 2024 15:27:11 EDT Ventricular Rate:  83 PR Interval:  136 QRS Duration:  100 QT Interval:  398 QTC Calculation: 467 R Axis:   -18  Text Interpretation: Normal sinus rhythm Incomplete right bundle branch block No previous ECGs available Confirmed by Ahmad Alert ([id number]) on 01/23/2024 3:47:02 PM     Recent Labs: No results found for requested labs within last 365 days.    Lipid Panel    Component Value Date/Time   CHOL 138 05/22/2021 1000   CHOL 136 07/11/2013 0925   TRIG 60 05/22/2021 1000   TRIG 96 07/11/2013 0925   HDL 60 05/22/2021 1000   HDL 57 07/11/2013 0925   CHOLHDL 2.3 05/22/2021 1000   CHOLHDL 2  11/20/2019 1530   VLDL 13.4 11/20/2019 1530   LDLCALC 65 05/22/2021 1000   LDLCALC 60 07/11/2013 0925      Wt Readings from Last 3 Encounters:  01/23/24 192 lb 3.2 oz (87.2 kg)  08/25/23 185 lb (83.9 kg)  03/24/23 185 lb 8 oz (84.1 kg)      Other studies Reviewed: Additional studies/ records that were reviewed today include: . Review of the above records demonstrates:   ASSESSMENT AND PLAN:    2.  Hyperlipidemia-     her last LDL this am was 66.  Continue current meds.    3.  Sinus bradycardia:        4.  Systolic murmur:     Her systolic murmur is very soft  Had mild MR at her last echo in 2020  No change in her murmur or symptoms    Current medicines are reviewed at length with the patient today.  The patient does not have concerns regarding medicines.  The following changes have been made:  no change  Labs/ tests ordered today include:   Orders Placed This Encounter  Procedures   EKG 12-Lead      Disposition:      Ahmad Alert, MD  01/23/2024 3:46 PM    [facility] [address] [place], [place], [place]  [address] Phone: [phone]; Fax: [phone]

## 2024-01-23 ENCOUNTER — Encounter: Payer: Self-pay | Admitting: Cardiovascular Disease

## 2024-01-23 ENCOUNTER — Ambulatory Visit: Attending: Cardiovascular Disease | Admitting: Cardiovascular Disease

## 2024-01-23 VITALS — BP 104/72 | HR 88 | Ht 65.5 in | Wt 192.2 lb

## 2024-01-23 DIAGNOSIS — E782 Mixed hyperlipidemia: Secondary | ICD-10-CM | POA: Diagnosis not present

## 2024-01-23 DIAGNOSIS — I2510 Atherosclerotic heart disease of native coronary artery without angina pectoris: Secondary | ICD-10-CM | POA: Diagnosis not present

## 2024-01-23 NOTE — Patient Instructions (Signed)
 Follow-Up: At [facility], you and your health needs are our priority.  As part of our continuing mission to provide you with exceptional heart care, our providers are all part of one team.  This team includes your primary Cardiologist (physician) and Advanced Practice Providers or APPs (Physician Assistants and Nurse Practitioners) who all work together to provide you with the care you need, when you need it.  Your next appointment:   1 year(s)  Provider:   Ahmad Alert, MD

## 2024-01-23 NOTE — Progress Notes (Signed)
 Follow Up Note  RE: Jessica Bradford MRN: [id number] DOB: Oct 28, 1965 Date of Office Visit: 01/24/2024  Referring provider: Sallye Crease, NP Primary care provider: Sallye Crease, NP  Chief Complaint: Follow-up  History of Present Illness: I had the pleasure of seeing Jessica Bradford for a follow up visit at the [facility] on 01/24/2024. She is a 58 y.o. female, who is being followed for asthma, allergic rhinitis. Her previous allergy  office visit was on 08/25/2023 with Dr. Burdette Carolin. Today is a regular follow up visit.  Discussed the use of AI scribe software for clinical note transcription with the patient, who gave verbal consent to proceed.    She has reduced her Pulmicort  inhaler use to one puff once a day since March without any change in her symptoms. She has not experienced bronchitis over the winter and has not needed to use her rescue inhaler.  She experiences significant eye watering and irritation when she goes into her office, which is now located on a different floor. This has been occurring since the move to the third floor. Her eyes become so irritated that her eyelids and the area underneath become red. She has been using saline drops and a cream provided by a dermatologist, which has improved the condition. She has not experienced these symptoms in the past two weeks while not going to the office.  She has a history of allergies and uses Singulair  regularly. During the fall, she used additional allergy  medications as needed. An allergy  test five years ago was positive for cat, dog and mold allergies. She suspects there might be something in the office environment causing her symptoms, as she does not experience them at home.  She previously experienced coughing issues on the seventh floor of her office, which was later found to have mold under the carpet.     Assessment and Plan: Jessica Bradford is a 58 y.o. female with: Mild persistent asthma without  complication Past history - Used to be on Symbicort  in the past. 1 prednisone  course last year. Flares in the fall/winter.  Interim history - no flares with decrease in maintenance inhaler.  Breathing test unremarkable today.  Daily controller medication(s): stop Pulmicort .  If you notice issues then go back to 1 puff once a day as before.  May use Airsupra rescue inhaler 2 puffs every 4 to 6 hours as needed for shortness of breath, chest tightness, coughing, and wheezing. Do not use more than 12 puffs in 24 hours. May use Airsupra rescue inhaler 2 puffs 5 to 15 minutes prior to strenuous physical activities. Rinse mouth after each use.  Coupon given. Monitor frequency of use - if you need to use it more than twice per week on a consistent basis let us  know.    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 Steroid nasal sprays cause epistaxis. Interim history - stable.  Continue environmental control measures. Continue Singulair  (montelukast ) 10mg  daily at night. Use over the counter antihistamines such as Zyrtec (cetirizine), Claritin (loratadine), Allegra (fexofenadine), or Xyzal (levocetirizine) daily as needed. May take twice a day during allergy  flares. May switch antihistamines every few months. Use olopatadine  eye drops 0.2% once a day as needed for itchy/watery eyes. Pataday  eye drop sample given.  May use saline nasal spray as needed.   Return in about 5 months (around 06/25/2024).  Meds ordered this encounter  Medications  Albuterol -Budesonide  (AIRSUPRA) 90-80 MCG/ACT AERO    Sig: Inhale 2 puffs into the lungs every 4 (four) hours as needed (coughing, wheezing, chest tightness). Do not exceed 12 puffs in 24 hours.    Dispense:  10.7 g    Refill:  2    BIN: [id number], PCN: [id number], GRP: [id number], ID [id number]   Lab Orders  No laboratory test(s) ordered today    Diagnostics: Spirometry:   Tracings reviewed. Her effort: It was hard to get consistent efforts and there is a question as to whether this reflects a maximal maneuver. FVC: 3.47L FEV1: 2.86L, 110% predicted FEV1/FVC ratio: 82% Interpretation: No overt abnormalities noted given today's efforts.  Please see scanned spirometry results for details.  Results discussed with patient/family.   Medication List:  Current Outpatient Medications  Medication Sig Dispense Refill   albuterol  (PROAIR  HFA) 108 (90 Base) MCG/ACT inhaler TAKE 2 PUFFS BY MOUTH EVERY 6 HOURS AS NEEDED FOR WHEEZE OR SHORTNESS OF BREATH 8.5 g 2   Albuterol -Budesonide  (AIRSUPRA) 90-80 MCG/ACT AERO Inhale 2 puffs into the lungs every 4 (four) hours as needed (coughing, wheezing, chest tightness). Do not exceed 12 puffs in 24 hours. 10.7 g 2   ALPRAZolam (XANAX) 0.25 MG tablet 0.5-1 Tablet(s) By Mouth PRN     aspirin 81 MG chewable tablet Chew by mouth.     atorvastatin  (LIPITOR) 80 MG tablet TAKE 1 TABLET BY MOUTH EVERY DAY 90 tablet 0   meloxicam (MOBIC) 15 MG tablet Take 15 mg by mouth daily.     montelukast  (SINGULAIR ) 10 MG tablet TAKE 1 TABLET BY MOUTH EVERYDAY AT BEDTIME 90 tablet 0   multivitamin (THERAGRAN) per tablet Take 1 tablet by mouth daily.     oxybutynin (DITROPAN XL) 15 MG 24 hr tablet Take 15 mg by mouth every morning.     sertraline (ZOLOFT) 50 MG tablet Take 1 tablet by mouth daily.  2   topiramate (TOPAMAX) 25 MG tablet Take by mouth.  Take one tablet (25 mg dose) by mouth at bedtime     WEGOVY 2.4 MG/0.75ML SOAJ Inject into the skin.     nitroGLYCERIN  (NITROSTAT ) 0.4 MG SL tablet Place 1 tablet (0.4 mg total) under the tongue every 5 (five) minutes as needed for chest pain. 30 tablet 3   No current facility-administered medications for this visit.   Allergies: Allergies  Allergen Reactions   Sulfa Antibiotics Rash   Doxycycline Rash and Other (See Comments)    Other reaction(s): vomiting   Elemental Sulfur Rash   I reviewed  her past medical history, social history, family history, and environmental history and no significant changes have been reported from her previous visit.  Review of Systems  Constitutional:  Negative for appetite change, chills, fever and unexpected weight change.  HENT:  Negative for congestion, postnasal drip and rhinorrhea.   Eyes:  Negative for itching.  Respiratory:  Negative for cough, chest tightness, shortness of breath and wheezing.   Gastrointestinal:  Negative for abdominal pain.  Skin:  Negative for rash.  Allergic/Immunologic: Positive for environmental allergies.  Neurological:  Negative for headaches.    Objective: BP 90/60   Pulse 60   Temp 98.1 F (36.7 C)   Resp 16   Wt 190 lb 12 oz (86.5 kg)   LMP 09/23/2017 (Exact Date)   SpO2 98%   BMI 31.26 kg/m  Body mass index is 31.26 kg/m. Physical Exam Vitals and nursing note reviewed.  Constitutional:      Appearance: Normal  appearance. She is well-developed.  HENT:     Head: Normocephalic and atraumatic.     Right Ear: Tympanic membrane and external ear normal.     Left Ear: Tympanic membrane and external ear normal.     Nose: Nose normal.     Mouth/Throat:     Mouth: Mucous membranes are moist.     Pharynx: Oropharynx is clear.  Eyes:     Conjunctiva/sclera: Conjunctivae normal.  Cardiovascular:     Rate and Rhythm: Normal rate and regular rhythm.     Heart sounds: Normal heart sounds. No murmur heard. Pulmonary:     Effort: Pulmonary effort is normal.     Breath sounds: Normal breath sounds. No wheezing, rhonchi or rales.  Musculoskeletal:     Cervical back: Neck supple.  Skin:    General: Skin is warm.     Findings: No rash.  Neurological:     Mental Status: She is alert and oriented to person, place, and time.  Psychiatric:        Behavior: Behavior normal.   Previous notes and tests were reviewed. The plan was reviewed with the patient/family, and all questions/concerned were addressed.  It  was my pleasure to see Jessica Bradford today and participate in her care. Please feel free to contact me with any questions or concerns.  Sincerely,  Eudelia Hero, DO Allergy  & Immunology  [facility]  [employer] office: [phone] [facility] office: [phone]

## 2024-01-24 ENCOUNTER — Encounter: Payer: Self-pay | Admitting: Allergy

## 2024-01-24 ENCOUNTER — Ambulatory Visit: Payer: No Typology Code available for payment source | Admitting: Allergy

## 2024-01-24 VITALS — BP 90/60 | HR 60 | Temp 98.1°F | Resp 16 | Wt 190.8 lb

## 2024-01-24 DIAGNOSIS — J3081 Allergic rhinitis due to animal (cat) (dog) hair and dander: Secondary | ICD-10-CM

## 2024-01-24 DIAGNOSIS — J3089 Other allergic rhinitis: Secondary | ICD-10-CM

## 2024-01-24 DIAGNOSIS — J4530 Mild persistent asthma, uncomplicated: Secondary | ICD-10-CM

## 2024-01-24 MED ORDER — AIRSUPRA 90-80 MCG/ACT IN AERO: 2.0000 | INHALATION_SPRAY | RESPIRATORY_TRACT | 2 refills | Status: AC | PRN

## 2024-01-24 NOTE — Patient Instructions (Addendum)
 Asthma  Breathing test unremarkable today.  Daily controller medication(s): stop Pulmicort .  If you notice issues then go back to 1 puff once a day as before.  May use Airsupra rescue inhaler 2 puffs every 4 to 6 hours as needed for shortness of breath, chest tightness, coughing, and wheezing. Do not use more than 12 puffs in 24 hours. May use Airsupra rescue inhaler 2 puffs 5 to 15 minutes prior to strenuous physical activities. Rinse mouth after each use.  Coupon given. Monitor frequency of use - if you need to use it more than twice per week on a consistent basis let us  know.  Breathing control goals:  Full participation in all desired activities (may need albuterol  before activity) Albuterol  use two times or less a week on average (not counting use with activity) Cough interfering with sleep two times or less a month Oral steroids no more than once a year No hospitalizations   Allergic rhinitis 2020 skin testing positive to mold, cat and dog. Continue environmental control measures. Continue Singulair  (montelukast ) 10mg  daily at night. Use over the counter antihistamines such as Zyrtec (cetirizine), Claritin (loratadine), Allegra (fexofenadine), or Xyzal (levocetirizine) daily as needed. May take twice a day during allergy  flares. May switch antihistamines every few months. Use olopatadine  eye drops 0.2% once a day as needed for itchy/watery eyes. Pataday  eye drop sample given.  May use saline nasal spray as needed.  Follow up in 5 months or sooner if needed.

## 2024-01-25 ENCOUNTER — Encounter: Payer: Self-pay | Admitting: Cardiovascular Disease

## 2024-02-08 ENCOUNTER — Encounter: Payer: Self-pay | Admitting: Allergy

## 2024-02-08 ENCOUNTER — Encounter

## 2024-02-10 ENCOUNTER — Other Ambulatory Visit

## 2024-03-23 ENCOUNTER — Encounter: Payer: Self-pay | Admitting: Allergy

## 2024-03-26 MED ORDER — MONTELUKAST SODIUM 10 MG PO TABS: 10.0000 mg | ORAL_TABLET | Freq: Every day | ORAL | 0 refills | Status: DC

## 2024-05-29 ENCOUNTER — Telehealth: Payer: Self-pay | Admitting: Cardiovascular Disease

## 2024-05-29 MED ORDER — ATORVASTATIN CALCIUM 80 MG PO TABS: 80.0000 mg | ORAL_TABLET | Freq: Every day | ORAL | 2 refills | Status: AC

## 2024-05-29 NOTE — Telephone Encounter (Signed)
 RX sent in

## 2024-05-29 NOTE — Telephone Encounter (Signed)
*  STAT* If patient is at the pharmacy, call can be transferred to refill team.   1. Which medications need to be refilled? (please list name of each medication and dose if known)   atorvastatin  (LIPITOR) 80 MG tablet     2. Would you like to learn more about the convenience, safety, & potential cost savings by using the [facility]? No   3. Are you open to using the [facility] (Type [facility]. No   4. Which pharmacy/location (including street and city if local pharmacy) is medication to be sent to? [id number] - [place], [place] - [address] [place]    5. Do they need a 30 day or 90 day supply? 90 day

## 2024-06-15 ENCOUNTER — Other Ambulatory Visit: Payer: Self-pay | Admitting: Medical Genetics

## 2024-06-15 DIAGNOSIS — Z006 Encounter for examination for normal comparison and control in clinical research program: Secondary | ICD-10-CM

## 2024-06-22 ENCOUNTER — Other Ambulatory Visit: Payer: Self-pay | Admitting: Allergy

## 2024-06-26 ENCOUNTER — Ambulatory Visit: Admitting: Allergy

## 2024-07-10 ENCOUNTER — Other Ambulatory Visit: Payer: Self-pay

## 2024-07-10 ENCOUNTER — Encounter: Payer: Self-pay | Admitting: Allergy

## 2024-07-10 ENCOUNTER — Ambulatory Visit (INDEPENDENT_AMBULATORY_CARE_PROVIDER_SITE_OTHER): Admitting: Allergy

## 2024-07-10 VITALS — BP 112/68 | HR 76 | Temp 98.6°F | Resp 18 | Ht 65.0 in | Wt 190.0 lb

## 2024-07-10 DIAGNOSIS — J4520 Mild intermittent asthma, uncomplicated: Secondary | ICD-10-CM | POA: Diagnosis not present

## 2024-07-10 DIAGNOSIS — J3081 Allergic rhinitis due to animal (cat) (dog) hair and dander: Secondary | ICD-10-CM | POA: Diagnosis not present

## 2024-07-10 DIAGNOSIS — J3089 Other allergic rhinitis: Secondary | ICD-10-CM

## 2024-07-10 MED ORDER — IPRATROPIUM BROMIDE 0.03 % NA SOLN: 1.0000 | Freq: Two times a day (BID) | NASAL | 5 refills | Status: AC | PRN

## 2024-07-10 NOTE — Progress Notes (Signed)
 Follow Up Note  RE: Jessica Bradford MRN: [id number] DOB: [birth year] Date of Office Visit: 07/10/2024  Referring provider: Teresa Aldona LITTIE, NP Primary care provider: Teresa Aldona LITTIE, NP  Chief Complaint: Follow-up (Doing well, no refills needed, spring is bad season)  History of Present Illness: I had the pleasure of seeing Jessica Bradford for a follow up visit at the [facility] on 07/10/2024. She is a 58 y.o. female, who is being followed for asthma, allergic rhinitis. Her previous allergy  office visit was on 01/24/2024 with Dr. Luke. Today is a regular follow up visit.  Discussed the use of AI scribe software for clinical note transcription with the patient, who gave verbal consent to proceed.    She discontinued Pulmicort  and reported no issues.   She has had a mild cough since the fall, primarily in the mornings, accompanied by PND. She attributes these symptoms to allergies, especially when spending time outdoors. During a cold a couple of months ago, she used Airsupra  once due to chest tightness, which provided relief. She has not required emergency care or prednisone .  She currently takes montelukast  and has added Xyzal to her regimen. She uses a saline spray for nasal symptoms but does not use a medicated nasal spray. She experiences postnasal drip, particularly in the mornings, which she feels in her throat.  She notes a significant improvement in her symptoms after moving to a new building in the summer, as she previously experienced persistent cough and frequent illness in her former location.  No recent fevers, chills, or changes in appetite. She takes Pepcid  daily for heartburn or reflux.     Assessment and Plan: Jessica Bradford is a 59 y.o. female with: Mild intermittent asthma without complication Past history - Used to be on Symbicort  in the past. 1 prednisone  course last year. Flares in the fall/winter.  Interim history - no issues with stopping Pulmicort .  Only needed to use Airsupra  once.  Normal spirometry today.  May use Airsupra  rescue inhaler 2 puffs every 4 to 6 hours as needed for shortness of breath, chest tightness, coughing, and wheezing. Do not use more than 12 puffs in 24 hours. May use Airsupra  rescue inhaler 2 puffs 5 to 15 minutes prior to strenuous physical activities. Rinse mouth after each use.  Monitor frequency of use - if you need to use it more than twice per week on a consistent basis let us  know.    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 Steroid nasal sprays cause epistaxis. Interim history - some PND. Continue environmental control measures. Continue Singulair  (montelukast ) 10mg  daily at night. Use over the counter antihistamines such as Zyrtec (cetirizine), Claritin (loratadine), Allegra (fexofenadine), or Xyzal (levocetirizine) daily as needed. May take twice a day during allergy  flares. May switch antihistamines every few months. Use olopatadine  eye drops 0.2% once a day as needed for itchy/watery eyes. May use saline nasal spray as needed. Use Atrovent (ipratropium) 0.03% 1-2 sprays per nostril twice a day as needed for runny nose/drainage. Consider retesting if worsening symptoms.  Return in about 6 months (around 01/07/2025).  Meds ordered this encounter  Medications   ipratropium (ATROVENT) 0.03 % nasal spray    Sig: Place 1-2 sprays into both nostrils 2 (two) times daily as needed (nasal drainage).    Dispense:  30 mL    Refill:  5   Lab Orders  No laboratory test(s) ordered today    Diagnostics: Spirometry:  Tracings reviewed. Her effort: Good reproducible efforts. FVC: 3.77L FEV1: 2.95L, 114% predicted FEV1/FVC ratio: 78% Interpretation: Spirometry consistent with normal pattern.  Please see scanned spirometry results for details.  Results discussed with patient/family.   Medication List:  Current  Outpatient Medications  Medication Sig Dispense Refill   albuterol  (PROAIR  HFA) 108 (90 Base) MCG/ACT inhaler TAKE 2 PUFFS BY MOUTH EVERY 6 HOURS AS NEEDED FOR WHEEZE OR SHORTNESS OF BREATH 8.5 g 2   Albuterol -Budesonide  (AIRSUPRA ) 90-80 MCG/ACT AERO Inhale 2 puffs into the lungs every 4 (four) hours as needed (coughing, wheezing, chest tightness). Do not exceed 12 puffs in 24 hours. 10.7 g 2   ALPRAZolam (XANAX) 0.25 MG tablet 0.5-1 Tablet(s) By Mouth PRN     aspirin 81 MG chewable tablet Chew by mouth.     atorvastatin  (LIPITOR) 80 MG tablet Take 1 tablet (80 mg total) by mouth daily. 90 tablet 2   ipratropium (ATROVENT) 0.03 % nasal spray Place 1-2 sprays into both nostrils 2 (two) times daily as needed (nasal drainage). 30 mL 5   meloxicam (MOBIC) 15 MG tablet Take 15 mg by mouth daily.     montelukast  (SINGULAIR ) 10 MG tablet TAKE 1 TABLET BY MOUTH EVERYDAY AT BEDTIME 90 tablet 0   multivitamin (THERAGRAN) per tablet Take 1 tablet by mouth daily.     nitroGLYCERIN  (NITROSTAT ) 0.4 MG SL tablet Place 1 tablet (0.4 mg total) under the tongue every 5 (five) minutes as needed for chest pain. 30 tablet 3   oxybutynin (DITROPAN XL) 15 MG 24 hr tablet Take 15 mg by mouth every morning.     sertraline (ZOLOFT) 50 MG tablet Take 1 tablet by mouth daily.  2   topiramate (TOPAMAX) 25 MG tablet Take by mouth.  Take one tablet (25 mg dose) by mouth at bedtime     WEGOVY 2.4 MG/0.75ML SOAJ Inject into the skin.     No current facility-administered medications for this visit.   Allergies: Allergies  Allergen Reactions   Sulfa Antibiotics Rash   Doxycycline Rash and Other (See Comments)    Other reaction(s): vomiting   Elemental Sulfur Rash   I reviewed her past medical history, social history, family history, and environmental history and no significant changes have been reported from her previous visit.  Review of Systems  Constitutional:  Negative for appetite change, chills, fever and  unexpected weight change.  HENT:  Positive for postnasal drip. Negative for congestion and rhinorrhea.   Eyes:  Negative for itching.  Respiratory:  Positive for cough. Negative for chest tightness, shortness of breath and wheezing.   Gastrointestinal:  Negative for abdominal pain.  Skin:  Negative for rash.  Allergic/Immunologic: Positive for environmental allergies.  Neurological:  Negative for headaches.    Objective: BP 112/68   Pulse 76   Temp 98.6 F (37 C) (Temporal)   Resp 18   Ht 5' 5 (1.651 m)   Wt 190 lb (86.2 kg)   LMP 09/23/2017 (Exact Date)   SpO2 97%   BMI 31.62 kg/m  Body mass index is 31.62 kg/m. Physical Exam Vitals and nursing note reviewed.  Constitutional:      Appearance: Normal appearance. She is well-developed.  HENT:     Head: Normocephalic and atraumatic.     Right Ear: Tympanic membrane and external ear normal.     Left Ear: Tympanic membrane and external ear normal.     Nose: Nose  normal.     Mouth/Throat:     Mouth: Mucous membranes are moist.     Pharynx: Oropharynx is clear.  Eyes:     Conjunctiva/sclera: Conjunctivae normal.  Cardiovascular:     Rate and Rhythm: Normal rate and regular rhythm.     Heart sounds: Normal heart sounds. No murmur heard. Pulmonary:     Effort: Pulmonary effort is normal.     Breath sounds: Normal breath sounds. No wheezing, rhonchi or rales.  Musculoskeletal:     Cervical back: Neck supple.  Skin:    General: Skin is warm.     Findings: No rash.  Neurological:     Mental Status: She is alert and oriented to person, place, and time.  Psychiatric:        Behavior: Behavior normal.    Previous notes and tests were reviewed. The plan was reviewed with the patient/family, and all questions/concerned were addressed.  It was my pleasure to see Jessica Bradford today and participate in her care. Please feel free to contact me with any questions or concerns.  Sincerely,  Orlan Cramp, DO Allergy  &  Immunology  [facility]  [place] office: [phone] [facility] office: [phone]

## 2024-07-10 NOTE — Patient Instructions (Addendum)
 Asthma  Normal breathing test today.  May use Airsupra  rescue inhaler 2 puffs every 4 to 6 hours as needed for shortness of breath, chest tightness, coughing, and wheezing. Do not use more than 12 puffs in 24 hours. May use Airsupra  rescue inhaler 2 puffs 5 to 15 minutes prior to strenuous physical activities. Rinse mouth after each use.  Monitor frequency of use - if you need to use it more than twice per week on a consistent basis let us  know.  Breathing control goals:  Full participation in all desired activities (may need albuterol  before activity) Albuterol  use two times or less a week on average (not counting use with activity) Cough interfering with sleep two times or less a month Oral steroids no more than once a year No hospitalizations   Allergic rhinitis 2020 skin testing positive to mold, cat and dog. Continue environmental control measures. Continue Singulair  (montelukast ) 10mg  daily at night. Use over the counter antihistamines such as Zyrtec (cetirizine), Claritin (loratadine), Allegra (fexofenadine), or Xyzal (levocetirizine) daily as needed. May take twice a day during allergy  flares. May switch antihistamines every few months. Use olopatadine  eye drops 0.2% once a day as needed for itchy/watery eyes. May use saline nasal spray as needed. Use Atrovent (ipratropium) 0.03% 1-2 sprays per nostril twice a day as needed for runny nose/drainage.  Follow up in 6 months or sooner if needed.

## 2024-10-04 ENCOUNTER — Ambulatory Visit: Admitting: Cardiology

## 2025-01-08 ENCOUNTER — Ambulatory Visit: Admitting: Allergy
